# Patient Record
Sex: Male | Born: 1960 | Race: White | Hispanic: No | Marital: Single | Smoking: Former smoker
Health system: Southern US, Community
[De-identification: ages and names within clinical notes are randomized; demographics above are authoritative.]

## PROBLEM LIST (undated history)

## (undated) DIAGNOSIS — R7989 Other specified abnormal findings of blood chemistry: Secondary | ICD-10-CM

## (undated) DIAGNOSIS — I471 Supraventricular tachycardia, unspecified: Secondary | ICD-10-CM

## (undated) DIAGNOSIS — I4892 Unspecified atrial flutter: Secondary | ICD-10-CM

## (undated) DIAGNOSIS — Z8659 Personal history of other mental and behavioral disorders: Secondary | ICD-10-CM

## (undated) DIAGNOSIS — I480 Paroxysmal atrial fibrillation: Secondary | ICD-10-CM

## (undated) DIAGNOSIS — N182 Chronic kidney disease, stage 2 (mild): Secondary | ICD-10-CM

## (undated) DIAGNOSIS — I10 Essential (primary) hypertension: Secondary | ICD-10-CM

## (undated) DIAGNOSIS — Z86718 Personal history of other venous thrombosis and embolism: Secondary | ICD-10-CM

## (undated) DIAGNOSIS — D6851 Activated protein C resistance: Secondary | ICD-10-CM

## (undated) DIAGNOSIS — N200 Calculus of kidney: Secondary | ICD-10-CM

## (undated) DIAGNOSIS — N201 Calculus of ureter: Secondary | ICD-10-CM

## (undated) DIAGNOSIS — I4719 Other supraventricular tachycardia: Secondary | ICD-10-CM

## (undated) HISTORY — PX: ROTATOR CUFF REPAIR: SHX139

## (undated) HISTORY — PX: APPENDECTOMY: SHX54

## (undated) HISTORY — DX: Other specified abnormal findings of blood chemistry: R79.89

---

## 1998-07-07 ENCOUNTER — Emergency Department (HOSPITAL_COMMUNITY): Admission: EM | Admit: 1998-07-07 | Discharge: 1998-07-07 | Payer: Self-pay | Admitting: Emergency Medicine

## 2000-09-06 ENCOUNTER — Encounter (INDEPENDENT_AMBULATORY_CARE_PROVIDER_SITE_OTHER): Payer: Self-pay | Admitting: Specialist

## 2000-09-06 ENCOUNTER — Ambulatory Visit (HOSPITAL_COMMUNITY): Admission: RE | Admit: 2000-09-06 | Discharge: 2000-09-06 | Payer: Self-pay | Admitting: General Surgery

## 2002-04-19 ENCOUNTER — Emergency Department (HOSPITAL_COMMUNITY): Admission: EM | Admit: 2002-04-19 | Discharge: 2002-04-19 | Payer: Self-pay | Admitting: Emergency Medicine

## 2005-07-08 ENCOUNTER — Emergency Department (HOSPITAL_COMMUNITY): Admission: EM | Admit: 2005-07-08 | Discharge: 2005-07-08 | Payer: Self-pay | Admitting: Emergency Medicine

## 2007-05-22 ENCOUNTER — Emergency Department (HOSPITAL_COMMUNITY): Admission: EM | Admit: 2007-05-22 | Discharge: 2007-05-22 | Payer: Self-pay | Admitting: Emergency Medicine

## 2009-03-20 ENCOUNTER — Encounter: Admission: RE | Admit: 2009-03-20 | Discharge: 2009-03-20 | Payer: Self-pay | Admitting: Internal Medicine

## 2009-07-14 ENCOUNTER — Emergency Department (HOSPITAL_COMMUNITY): Admission: EM | Admit: 2009-07-14 | Discharge: 2009-07-14 | Payer: Self-pay | Admitting: Emergency Medicine

## 2011-03-10 LAB — POCT I-STAT, CHEM 8
Calcium, Ion: 1.13 mmol/L (ref 1.12–1.32)
Creatinine, Ser: 1.2 mg/dL (ref 0.4–1.5)
Glucose, Bld: 103 mg/dL — ABNORMAL HIGH (ref 70–99)
Hemoglobin: 15 g/dL (ref 13.0–17.0)
Sodium: 142 mEq/L (ref 135–145)
TCO2: 25 mmol/L (ref 0–100)

## 2011-03-10 LAB — CBC
HCT: 42.4 % (ref 39.0–52.0)
Hemoglobin: 14.5 g/dL (ref 13.0–17.0)
MCV: 88.4 fL (ref 78.0–100.0)
Platelets: 251 10*3/uL (ref 150–400)
WBC: 13 10*3/uL — ABNORMAL HIGH (ref 4.0–10.5)

## 2011-03-10 LAB — URINE CULTURE

## 2011-03-10 LAB — URINALYSIS, ROUTINE W REFLEX MICROSCOPIC
Bilirubin Urine: NEGATIVE
Ketones, ur: NEGATIVE mg/dL
Protein, ur: NEGATIVE mg/dL
Urobilinogen, UA: 1 mg/dL (ref 0.0–1.0)

## 2011-03-10 LAB — DIFFERENTIAL
Eosinophils Absolute: 0.1 10*3/uL (ref 0.0–0.7)
Eosinophils Relative: 1 % (ref 0–5)
Lymphs Abs: 2.1 10*3/uL (ref 0.7–4.0)
Monocytes Absolute: 0.5 10*3/uL (ref 0.1–1.0)
Monocytes Relative: 4 % (ref 3–12)

## 2011-03-10 LAB — URINE MICROSCOPIC-ADD ON

## 2011-09-19 LAB — POCT I-STAT CREATININE
Creatinine, Ser: 1.1
Operator id: 146091

## 2011-09-19 LAB — POCT CARDIAC MARKERS: Troponin i, poc: <0.05

## 2011-09-19 LAB — I-STAT 8, (EC8 V) (CONVERTED LAB)
Acid-Base Excess: 2
Bicarbonate: 29.3 — ABNORMAL HIGH
Hemoglobin: 16
Potassium: 4.4
Sodium: 140
TCO2: 31
pH, Ven: 7.319 — ABNORMAL HIGH

## 2013-12-23 ENCOUNTER — Ambulatory Visit (INDEPENDENT_AMBULATORY_CARE_PROVIDER_SITE_OTHER): Payer: BC Managed Care – PPO

## 2013-12-23 VITALS — Ht 72.0 in | Wt 210.0 lb

## 2013-12-23 DIAGNOSIS — B351 Tinea unguium: Secondary | ICD-10-CM

## 2013-12-23 DIAGNOSIS — R52 Pain, unspecified: Secondary | ICD-10-CM

## 2013-12-23 DIAGNOSIS — M722 Plantar fascial fibromatosis: Secondary | ICD-10-CM

## 2013-12-23 DIAGNOSIS — B353 Tinea pedis: Secondary | ICD-10-CM

## 2013-12-23 MED ORDER — EFINACONAZOLE 10 % EX SOLN: 1.0000 [drp] | Freq: Every day | CUTANEOUS | Status: DC

## 2013-12-23 MED ORDER — MELOXICAM 15 MG PO TABS: 15.0000 mg | ORAL_TABLET | Freq: Every day | ORAL | Status: DC

## 2013-12-23 NOTE — Patient Instructions (Signed)
Onychomycosis/Fungal Toenails  WHAT IS IT? An infection that lies within the keratin of your nail plate that is caused by a fungus.  WHY ME? Fungal infections affect all ages, sexes, races, and creeds.  There may be many factors that predispose you to a fungal infection such as age, coexisting medical conditions such as diabetes, or an autoimmune disease; stress, medications, fatigue, genetics, etc.  Bottom line: fungus thrives in a warm, moist environment and your shoes offer such a location.  IS IT CONTAGIOUS? Theoretically, yes.  You do not want to share shoes, nail clippers or files with someone who has fungal toenails.  Walking around barefoot in the same room or sleeping in the same bed is unlikely to transfer the organism.  It is important to realize, however, that fungus can spread easily from one nail to the next on the same foot.  HOW DO WE TREAT THIS?  There are several ways to treat this condition.  Treatment may depend on many factors such as age, medications, pregnancy, liver and kidney conditions, etc.  It is best to ask your doctor which options are available to you.  1. No treatment.   Unlike many other medical concerns, you can live with this condition.  However for many people this can be a painful condition and may lead to ingrown toenails or a bacterial infection.  It is recommended that you keep the nails cut short to help reduce the amount of fungal nail. 2. Topical treatment.  These range from herbal remedies to prescription strength nail lacquers.  About 40-50% effective, topicals require twice daily application for approximately 9 to 12 months or until an entirely new nail has grown out.  The most effective topicals are medical grade medications available through physicians offices. 3. Oral antifungal medications.  With an 80-90% cure rate, the most common oral medication requires 3 to 4 months of therapy and stays in your system for a year as the new nail grows out.  Oral  antifungal medications do require blood work to make sure it is a safe drug for you.  A liver function panel will be performed prior to starting the medication and after the first month of treatment.  It is important to have the blood work performed to avoid any harmful side effects.  In general, this medication safe but blood work is required. 4. Laser Therapy.  This treatment is performed by applying a specialized laser to the affected nail plate.  This therapy is noninvasive, fast, and non-painful.  It is not covered by insurance and is therefore, out of pocket.  The results have been very good with a 80-95% cure rate.  [facility] is the only practice in the area to offer this therapy. Permanent Nail Avulsion.  Removing the entire nail so that a new nail will not grow back   For that fungus nails apply topical Jublia to the affected nails daily for 12 months duration one drop to each nail as instructed daily       .ICE INSTRUCTIONS  Apply ice or cold pack to the affected area at least 3 times a day for 10-15 minutes each time.  You should also use ice after prolonged activity or vigorous exercise.  Do not apply ice longer than 20 minutes at one time.  Always keep a cloth between your skin and the ice pack to prevent burns.  Being consistent and following these instructions will help control your symptoms.  We suggest you purchase  a gel ice pack because they are reusable and do bit leak.  Some of them are designed to wrap around the area.  Use the method that works best for you.  Here are some other suggestions for icing.   Use a frozen bag of peas or corn-inexpensive and molds well to your body, usually stays frozen for 10 to 20 minutes.  Wet a towel with cold water and squeeze out the excess until it's damp.  Place in a bag in the freezer for 20 minutes. Then remove and use.

## 2013-12-23 NOTE — Progress Notes (Signed)
   Subjective:    Patient ID: Derek CritchleyRory B [name], male    DOB: [birth year], 53 y.o.   MRN: [id number]  HPI Comments: '' LT FOOT HEEL IS BEEN SORE FOR 1 MONTH AND ITS GETTING BETTER.  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       Objective:   Physical Exam Neurologically epicritic and proprioceptive sensations intact and symmetric bilateral there is normal plantar response DTRs not listed vascular status is intact with pedal pulses palpable DP and PT +2/4 bilateral Refill time 3 seconds all digits skin color and turgor normal there is some dry skin and some slight fissuring the skin patient does have onychomycosis and friability of nails 1 through 5 bilateral. Patient has moccasin distribution of tinea occasional pruritus in the past. Orthopedic biomechanical exam reveals pain on palpation Magan plantar fascia medial calcaneal tubercle left heel it is not exquisite pain x-rays reveal no inferior calcaneal spurring thickening of fascial structures no fracture or other osseous abnormality noted.       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 Plan at this time for the treatments fascial strapping applied to the left foot maintained for 5 days patient is dispensed literature on plantar fasciitis to follow also prescription for Mobic will be dispensed. For the mycotic nails prescription for [facility]. Applied nail topical antifungal daily for 12 months duration as instructed. Patient is also given proxy 10 tubes of Luzu topical antifungal if the tinea flares up. Recheck in 2 weeks for followup may be K. for  orthoses based on progress and also consider steroid injections if no improvement.  Alvan Dameichard Mailyn Steichen DPM

## 2014-01-06 ENCOUNTER — Ambulatory Visit (INDEPENDENT_AMBULATORY_CARE_PROVIDER_SITE_OTHER): Payer: BC Managed Care – PPO

## 2014-01-06 VITALS — BP 157/92 | HR 60 | Resp 12

## 2014-01-06 DIAGNOSIS — R52 Pain, unspecified: Secondary | ICD-10-CM

## 2014-01-06 DIAGNOSIS — M722 Plantar fascial fibromatosis: Secondary | ICD-10-CM

## 2014-01-06 NOTE — Patient Instructions (Signed)

## 2014-01-06 NOTE — Progress Notes (Signed)
   Subjective:    Patient ID: Derek [name], male    DOB: Mar 28, 1961, 53 y.o.   MRN: [id number]  HPI patient presents this time for followup of plantar fasciitis/heel spur syndrome left foot but really good by the second day with a strapping and placed first the hurt however by the fifth able to take him off it became painful again. Patient applying topical antifungal to the nails as instructed had improvement with fascial taping and would likely benefit from orthoses   Review of Systems no new changes or findings     Objective:   Physical Exam Neurovascular status intact and unchanged from 2 weeks ago pedal pulses palpable left foot has pain on palpation medial band plantar fascia consistent with plantar fasciitis/heel spur syndrome responded to the strapping would likely respond to functional orthoses       Assessment & Plan:  Assessment plantar fasciitis/heel spur syndrome left fascial strapping with beneficial at this time orthotic scanning for functional orthoses with Spenco top cover is carried out patient will followup in the next 4 weeks orthotic and fitting and dispensing once they've been manufactured in the interim maintain NSAID therapy maintain ice in stable shoes at all times  Alvan Dameichard Tabbetha Kutscher DPM

## 2014-02-19 ENCOUNTER — Ambulatory Visit: Payer: BC Managed Care – PPO

## 2014-02-19 ENCOUNTER — Ambulatory Visit (INDEPENDENT_AMBULATORY_CARE_PROVIDER_SITE_OTHER): Payer: BC Managed Care – PPO | Admitting: *Deleted

## 2014-02-19 VITALS — BP 119/73 | HR 54 | Resp 17 | Ht 72.0 in | Wt 220.0 lb

## 2014-02-19 DIAGNOSIS — M722 Plantar fascial fibromatosis: Secondary | ICD-10-CM

## 2014-02-19 NOTE — Progress Notes (Signed)
   Subjective:    Patient ID: Derek CritchleyRory B [name], male    DOB: [birth year], 53 y.o.   MRN: [id number] Pt presents for orthotic pick up.  Verbal and written instructions given and orthotics placed in the pt's workboot.  I encouraged pt to make an appt for 1 month follow up or sooner if concerns. HPI    Review of Systems     Objective:   Physical Exam        Assessment & Plan:

## 2014-02-19 NOTE — Patient Instructions (Signed)

## 2014-02-25 ENCOUNTER — Telehealth: Payer: Self-pay | Admitting: *Deleted

## 2014-02-25 NOTE — Telephone Encounter (Signed)
I've made 3 visits there for Plantar Fasciitis.  My foot is the same as I first came.  I have spent $199 on each foot for inserts.  It's not better at all.  I left him a message to call and schedule an appointment for follow up with Dr. Ralene CorkSikora.  He can give him further options for treatment such as a cortisone shot.

## 2014-04-22 ENCOUNTER — Encounter (HOSPITAL_COMMUNITY): Payer: Self-pay | Admitting: Emergency Medicine

## 2014-04-22 ENCOUNTER — Emergency Department (HOSPITAL_COMMUNITY)
Admission: EM | Admit: 2014-04-22 | Discharge: 2014-04-22 | Disposition: A | Discharge status: Home / Self Care | Payer: BC Managed Care – PPO | Attending: Emergency Medicine | Admitting: Emergency Medicine

## 2014-04-22 ENCOUNTER — Emergency Department (HOSPITAL_COMMUNITY): Payer: BC Managed Care – PPO

## 2014-04-22 DIAGNOSIS — N201 Calculus of ureter: Secondary | ICD-10-CM | POA: Insufficient documentation

## 2014-04-22 DIAGNOSIS — F17200 Nicotine dependence, unspecified, uncomplicated: Secondary | ICD-10-CM | POA: Insufficient documentation

## 2014-04-22 DIAGNOSIS — F410 Panic disorder [episodic paroxysmal anxiety] without agoraphobia: Secondary | ICD-10-CM | POA: Insufficient documentation

## 2014-04-22 DIAGNOSIS — Z79899 Other long term (current) drug therapy: Secondary | ICD-10-CM | POA: Insufficient documentation

## 2014-04-22 LAB — BASIC METABOLIC PANEL
BUN: 19 mg/dL (ref 6–23)
CHLORIDE: 103 meq/L (ref 96–112)
CO2: 23 meq/L (ref 19–32)
Calcium: 9 mg/dL (ref 8.4–10.5)
Creatinine, Ser: 1.22 mg/dL (ref 0.50–1.35)
GFR calc Af Amer: 78 mL/min — ABNORMAL LOW (ref >90)
GFR calc non Af Amer: 67 mL/min — ABNORMAL LOW (ref >90)
Glucose, Bld: 104 mg/dL — ABNORMAL HIGH (ref 70–99)
Potassium: 3.8 mEq/L (ref 3.7–5.3)
SODIUM: 141 meq/L (ref 137–147)

## 2014-04-22 LAB — CBC WITH DIFFERENTIAL/PLATELET
BASOS ABS: 0 10*3/uL (ref 0.0–0.1)
Basophils Relative: 0 % (ref 0–1)
Eosinophils Absolute: 0.4 10*3/uL (ref 0.0–0.7)
Eosinophils Relative: 3 % (ref 0–5)
HEMATOCRIT: 42.6 % (ref 39.0–52.0)
Hemoglobin: 14.4 g/dL (ref 13.0–17.0)
LYMPHS PCT: 46 % (ref 12–46)
Lymphs Abs: 4.8 10*3/uL — ABNORMAL HIGH (ref 0.7–4.0)
MCH: 29.8 pg (ref 26.0–34.0)
MCHC: 33.8 g/dL (ref 30.0–36.0)
MCV: 88 fL (ref 78.0–100.0)
Monocytes Absolute: 0.7 10*3/uL (ref 0.1–1.0)
Monocytes Relative: 7 % (ref 3–12)
NEUTROS ABS: 4.6 10*3/uL (ref 1.7–7.7)
Neutrophils Relative %: 44 % (ref 43–77)
PLATELETS: 290 10*3/uL (ref 150–400)
RBC: 4.84 MIL/uL (ref 4.22–5.81)
RDW: 13 % (ref 11.5–15.5)
WBC: 10.5 10*3/uL (ref 4.0–10.5)

## 2014-04-22 LAB — URINE MICROSCOPIC-ADD ON

## 2014-04-22 LAB — URINALYSIS, ROUTINE W REFLEX MICROSCOPIC
Glucose, UA: NEGATIVE mg/dL
Ketones, ur: NEGATIVE mg/dL
Nitrite: NEGATIVE
Protein, ur: 30 mg/dL — AB
Specific Gravity, Urine: 1.034 — ABNORMAL HIGH (ref 1.005–1.030)
Urobilinogen, UA: 1 mg/dL (ref 0.0–1.0)
pH: 5 (ref 5.0–8.0)

## 2014-04-22 MED ORDER — ONDANSETRON HCL 4 MG PO TABS: 4.0000 mg | ORAL_TABLET | Freq: Four times a day (QID) | ORAL | Status: DC

## 2014-04-22 MED ORDER — OXYCODONE-ACETAMINOPHEN 5-325 MG PO TABS: 2.0000 | ORAL_TABLET | ORAL | Status: DC | PRN

## 2014-04-22 MED ORDER — OXYCODONE-ACETAMINOPHEN 5-325 MG PO TABS
2.0000 | ORAL_TABLET | Freq: Once | ORAL | Status: AC
Start: 2014-04-22 — End: 2014-04-22
  Administered 2014-04-22: 2 via ORAL
  Filled 2014-04-22: qty 2

## 2014-04-22 MED ORDER — ONDANSETRON HCL 4 MG/2ML IJ SOLN
4.0000 mg | Freq: Once | INTRAMUSCULAR | Status: AC
  Administered 2014-04-22: 4 mg via INTRAVENOUS
  Filled 2014-04-22: qty 2

## 2014-04-22 MED ORDER — HYDROMORPHONE HCL PF 1 MG/ML IJ SOLN
1.0000 mg | Freq: Once | INTRAMUSCULAR | Status: AC
  Administered 2014-04-22: 1 mg via INTRAVENOUS
  Filled 2014-04-22: qty 1

## 2014-04-22 MED ORDER — KETOROLAC TROMETHAMINE 30 MG/ML IJ SOLN
30.0000 mg | Freq: Once | INTRAMUSCULAR | Status: AC
  Administered 2014-04-22: 30 mg via INTRAVENOUS
  Filled 2014-04-22: qty 1

## 2014-04-22 MED ORDER — IBUPROFEN 800 MG PO TABS: 800.0000 mg | ORAL_TABLET | Freq: Three times a day (TID) | ORAL | Status: DC

## 2014-04-22 MED ORDER — TAMSULOSIN HCL 0.4 MG PO CAPS: 0.4000 mg | ORAL_CAPSULE | Freq: Every day | ORAL | Status: DC

## 2014-04-22 MED ORDER — SODIUM CHLORIDE 0.9 % IV BOLUS (SEPSIS)
1000.0000 mL | Freq: Once | INTRAVENOUS | Status: AC
  Administered 2014-04-22: 1000 mL via INTRAVENOUS

## 2014-04-22 NOTE — ED Notes (Signed)
Rt sided flank pain  X 2 hours has hx of kidney stone

## 2014-04-22 NOTE — Discharge Instructions (Signed)
Ureteral Colic (Kidney Stones) Followup with the urologist tomorrow. Take pain medication as prescribed. Return to ED if you develop new or worsening symptoms. Ureteral colic is the result of a condition when kidney stones form inside the kidney. Once kidney stones are formed they may move into the tube that connects the kidney with the bladder (ureter). If this occurs, this condition may cause pain (colic) in the ureter.  CAUSES  Pain is caused by stone movement in the ureter and the obstruction caused by the stone. SYMPTOMS  The pain comes and goes as the ureter contracts around the stone. The pain is usually intense, sharp, and stabbing in character. The location of the pain may move as the stone moves through the ureter. When the stone is near the kidney the pain is usually located in the back and radiates to the belly (abdomen). When the stone is ready to pass into the bladder the pain is often located in the lower abdomen on the side the stone is located. At this location, the symptoms may mimic those of a urinary tract infection with urinary frequency. Once the stone is located here it often passes into the bladder and the pain disappears completely. TREATMENT   Your caregiver will provide you with medicine for pain relief.  You may require specialized follow-up X-rays.  The absence of pain does not always mean that the stone has passed. It may have just stopped moving. If the urine remains completely obstructed, it can cause loss of kidney function or even complete destruction of the involved kidney. It is your responsibility and in your interest that X-rays and follow-ups as suggested by your caregiver are completed. Relief of pain without passage of the stone can be associated with severe damage to the kidney, including loss of kidney function on that side.  If your stone does not pass on its own, additional measures may be taken by your caregiver to ensure its removal. HOME CARE  INSTRUCTIONS   Increase your fluid intake. Water is the preferred fluid since juices containing vitamin C may acidify the urine making it less likely for certain stones (uric acid stones) to pass.  Strain all urine. A strainer will be provided. Keep all particulate matter or stones for your caregiver to inspect.  Take your pain medicine as directed.  Make a follow-up appointment with your caregiver as directed.  Remember that the goal is passage of your stone. The absence of pain does not mean the stone is gone. Follow your caregiver's instructions.  Only take over-the-counter or prescription medicines for pain, discomfort, or fever as directed by your caregiver. SEEK MEDICAL CARE IF:   Pain cannot be controlled with the prescribed medicine.  You have a fever.  Pain continues for longer than your caregiver advises it should.  There is a change in the pain, and you develop chest discomfort or constant abdominal pain.  You feel faint or pass out. MAKE SURE YOU:   Understand these instructions.  Will watch your condition.  Will get help right away if you are not doing well or get worse. Document Released: 08/29/2005 Document Revised: 03/16/2013 Document Reviewed: 05/16/2011 [facility] Patient Information 2014 [employer], [place].

## 2014-04-22 NOTE — ED Provider Notes (Signed)
CSN: [id number]     Arrival date & time 04/22/14  1439 History   First MD Initiated Contact with Patient 04/22/14 1512     Chief Complaint  Patient presents with  . Flank Pain     (Consider location/radiation/quality/duration/timing/severity/associated sxs/prior Treatment) HPI Comments: Patient with acute onset of right flank pain one hour ago while at work. Pain is constant it does not radiate. Feels similar to previous kidney stones. Associated with nausea and one episode of vomiting. Denies any fever or abdominal pain. No testicular pain. No previous intervention for kidney stones. Then take anything for pain. He's had previous appendectomy. Pain does not radiate down the legs. No focal weakness or tingling. No bowel or bladder incontinence.  The history is provided by the patient.    Past Medical History  Diagnosis Date  . Kidney stone   . Panic attacks    Past Surgical History  Procedure Laterality Date  . Appendectomy     No family history on file. History  Substance Use Topics  . Smoking status: Current Some Day Smoker  . Smokeless tobacco: Not on file  . Alcohol Use: Yes    Review of Systems  Constitutional: Negative for fever, activity change and appetite change.  Respiratory: Negative for chest tightness and shortness of breath.   Gastrointestinal: Positive for nausea and vomiting. Negative for abdominal pain.  Genitourinary: Positive for flank pain. Negative for decreased urine volume.  Musculoskeletal: Positive for back pain. Negative for arthralgias and myalgias.  Skin: Negative for rash.      Allergies  Review of patient's allergies indicates no known allergies.  Home Medications   Prior to Admission medications   Medication Sig Start Date End Date Taking? Authorizing Provider  ALPRAZolam Prudy Feeler(XANAX) 0.5 MG tablet Take 0.5 mg by mouth daily.  12/08/13  Yes Historical Provider, MD  sertraline (ZOLOFT) 50 MG tablet Take 50 mg by mouth daily.  12/08/13  Yes  Historical Provider, MD   BP 121/82  Pulse 48  Temp(Src) 98 F (36.7 C) (Oral)  Resp 18  SpO2 95% Physical Exam  Constitutional: He is oriented to person, place, and time. He appears well-developed and well-nourished. No distress.  HENT:  Head: Normocephalic and atraumatic.  Mouth/Throat: Oropharynx is clear and moist. No oropharyngeal exudate.  Eyes: Conjunctivae and EOM are normal. Pupils are equal, round, and reactive to light.  Neck: Normal range of motion. Neck supple.  Cardiovascular: Normal rate, regular rhythm and normal heart sounds.   No murmur heard. Pulmonary/Chest: Effort normal and breath sounds normal. No respiratory distress.  Abdominal: Soft. There is no tenderness. There is no rebound and no guarding.  No right lower quadrant tenderness. No pain at McBurney's point  Genitourinary:  No testicular tenderness  Musculoskeletal: Normal range of motion. He exhibits tenderness. He exhibits no edema.  Right CVA tenderness  Neurological: He is alert and oriented to person, place, and time. No cranial nerve deficit. He exhibits normal muscle tone. Coordination normal.  Skin: Skin is warm.    ED Course  Procedures (including critical care time) Labs Review Labs Reviewed  URINALYSIS, ROUTINE W REFLEX MICROSCOPIC - Abnormal; Notable for the following:    Color, Urine AMBER (*)    APPearance CLOUDY (*)    Specific Gravity, Urine 1.034 (*)    Hgb urine dipstick LARGE (*)    Bilirubin Urine SMALL (*)    Protein, ur 30 (*)    Leukocytes, UA SMALL (*)    All other components within  normal limits  CBC WITH DIFFERENTIAL - Abnormal; Notable for the following:    Lymphs Abs 4.8 (*)    All other components within normal limits  BASIC METABOLIC PANEL - Abnormal; Notable for the following:    Glucose, Bld 104 (*)    GFR calc non Af Amer 67 (*)    GFR calc Af Amer 78 (*)    All other components within normal limits  URINE MICROSCOPIC-ADD ON - Abnormal; Notable for the  following:    Squamous Epithelial / LPF FEW (*)    All other components within normal limits  URINE CULTURE    Imaging Review Ct Abdomen Pelvis Wo Contrast  04/22/2014   CLINICAL DATA:  Right flank pain, hematuria  EXAM: CT ABDOMEN AND PELVIS WITHOUT CONTRAST  TECHNIQUE: Multidetector CT imaging of the abdomen and pelvis was performed following the standard protocol without IV contrast.  COMPARISON:  07/15/2011.  FINDINGS: Sagittal images of the spine shows significant disc space flattening with vacuum disc phenomenon mild anterior and mild posterior spurring at L4-L5 and L5-S1 level. Mild disc space flattening at L3-L4 level.  Lung bases shows bilateral posterior atelectasis. Small hiatal hernia.  Unenhanced liver is unremarkable. No calcified gallstones are noted within gallbladder. The pancreas, spleen and adrenal glands are unremarkable. Small accessory splenule.  There is mild left hydronephrosis and left hydroureter. Mild left perinephric stranding. There is nonobstructive punctate calcified calculus in lower pole of the left kidney measures 1.8 mm.  In axial image 60 there is calcified obstructive calculus in mid right ureter measures 6.6 mm at the level of L4 vertebral body. Bilateral distal ureter is unremarkable. A prostate gland calcification is noted. No calcified calculi are noted within urinary bladder.  No aortic aneurysm. No small bowel obstruction. Stool noted within cecum. No pericecal inflammation. The terminal ileum is unremarkable. No distal colonic obstruction. No inguinal adenopathy. No destructive bony lesions are noted within pelvis. Mild degenerative changes bilateral SI joints.  IMPRESSION: 1. There is mild right hydronephrosis and right proximal hydroureter. Mild right perinephric stranding. 2. There is 6.6 mm calcified obstructive calculus in mid right ureter at the level of L4 vertebral body. 3. Left nonobstructive nephrolithiasis. No left ureteral calculi. No calcified calculi  are noted within urinary bladder. 4. No pericecal inflammation. 5. Degenerative changes lumbar spine and bilateral SI joints.   Electronically Signed   By: Natasha MeadLiviu  Pop M.D.   On: 04/22/2014 16:08     EKG Interpretation None      MDM   Final diagnoses:  Ureterolithiasis   Right flank pain with nausea and vomiting similar to previous kidney stone. No fever. No urinary symptoms.  Urinalysis shows hematuria without evidence of obvious infection. CT scan shows 6.6 mm right mid ureteral calculus. This was discussed with on-call urology Dr. Vernie Ammonsttelin who feels patient should call office tomorrow for an appointment for the weekend. Patient's pain is well controlled in the ED is not any vomiting. His creatinine is normal.  He is tolerating PO in the ED. Pain is controlled, no vomiting.  Will be discharged on pain medication, flomax, and antiinflammatories for follow up with urology tomorrow. Return precautions discussed.  Glynn OctaveStephen Chania Kochanski, MD 04/23/14 [phone]

## 2014-04-23 ENCOUNTER — Ambulatory Visit (HOSPITAL_BASED_OUTPATIENT_CLINIC_OR_DEPARTMENT_OTHER)
Admission: RE | Admit: 2014-04-23 | Discharge: 2014-04-23 | Disposition: A | Discharge status: Home / Self Care | Payer: BC Managed Care – PPO | Source: Ambulatory Visit | Attending: Urology | Admitting: Urology

## 2014-04-23 ENCOUNTER — Ambulatory Visit (HOSPITAL_BASED_OUTPATIENT_CLINIC_OR_DEPARTMENT_OTHER): Payer: BC Managed Care – PPO | Admitting: Anesthesiology

## 2014-04-23 ENCOUNTER — Emergency Department (HOSPITAL_COMMUNITY)
Admission: EM | Admit: 2014-04-23 | Discharge: 2014-04-23 | Disposition: A | Discharge status: Home / Self Care | Payer: BC Managed Care – PPO | Attending: Emergency Medicine | Admitting: Emergency Medicine

## 2014-04-23 ENCOUNTER — Other Ambulatory Visit: Payer: Self-pay | Admitting: Urology

## 2014-04-23 ENCOUNTER — Encounter (HOSPITAL_BASED_OUTPATIENT_CLINIC_OR_DEPARTMENT_OTHER): Payer: BC Managed Care – PPO | Admitting: Anesthesiology

## 2014-04-23 ENCOUNTER — Encounter (HOSPITAL_BASED_OUTPATIENT_CLINIC_OR_DEPARTMENT_OTHER): Payer: Self-pay | Admitting: *Deleted

## 2014-04-23 ENCOUNTER — Encounter (HOSPITAL_BASED_OUTPATIENT_CLINIC_OR_DEPARTMENT_OTHER)
Admission: RE | Disposition: A | Discharge status: Home / Self Care | Payer: Self-pay | Source: Ambulatory Visit | Attending: Urology

## 2014-04-23 DIAGNOSIS — N200 Calculus of kidney: Secondary | ICD-10-CM | POA: Insufficient documentation

## 2014-04-23 DIAGNOSIS — N201 Calculus of ureter: Secondary | ICD-10-CM | POA: Insufficient documentation

## 2014-04-23 DIAGNOSIS — F17200 Nicotine dependence, unspecified, uncomplicated: Secondary | ICD-10-CM | POA: Insufficient documentation

## 2014-04-23 DIAGNOSIS — F410 Panic disorder [episodic paroxysmal anxiety] without agoraphobia: Secondary | ICD-10-CM | POA: Insufficient documentation

## 2014-04-23 DIAGNOSIS — I498 Other specified cardiac arrhythmias: Secondary | ICD-10-CM | POA: Insufficient documentation

## 2014-04-23 DIAGNOSIS — N1330 Unspecified hydronephrosis: Secondary | ICD-10-CM | POA: Insufficient documentation

## 2014-04-23 DIAGNOSIS — F411 Generalized anxiety disorder: Secondary | ICD-10-CM | POA: Insufficient documentation

## 2014-04-23 DIAGNOSIS — Z87891 Personal history of nicotine dependence: Secondary | ICD-10-CM | POA: Insufficient documentation

## 2014-04-23 DIAGNOSIS — Z9089 Acquired absence of other organs: Secondary | ICD-10-CM | POA: Insufficient documentation

## 2014-04-23 DIAGNOSIS — D682 Hereditary deficiency of other clotting factors: Secondary | ICD-10-CM | POA: Insufficient documentation

## 2014-04-23 DIAGNOSIS — Z79899 Other long term (current) drug therapy: Secondary | ICD-10-CM | POA: Insufficient documentation

## 2014-04-23 HISTORY — DX: Personal history of other mental and behavioral disorders: Z86.59

## 2014-04-23 HISTORY — DX: Calculus of ureter: N20.1

## 2014-04-23 HISTORY — DX: Calculus of kidney: N20.0

## 2014-04-23 HISTORY — PX: OTHER SURGICAL HISTORY: SHX169

## 2014-04-23 HISTORY — PX: CYSTOSCOPY W/ URETERAL STENT PLACEMENT: SHX1429

## 2014-04-23 LAB — URINALYSIS, ROUTINE W REFLEX MICROSCOPIC
Glucose, UA: NEGATIVE mg/dL
KETONES UR: NEGATIVE mg/dL
Leukocytes, UA: NEGATIVE
NITRITE: NEGATIVE
Protein, ur: 30 mg/dL — AB
Specific Gravity, Urine: 1.039 — ABNORMAL HIGH (ref 1.005–1.030)
UROBILINOGEN UA: 0.2 mg/dL (ref 0.0–1.0)
pH: 5 (ref 5.0–8.0)

## 2014-04-23 LAB — URINE CULTURE
COLONY COUNT: NO GROWTH
CULTURE: NO GROWTH

## 2014-04-23 LAB — URINE MICROSCOPIC-ADD ON

## 2014-04-23 SURGERY — CYSTOSCOPY, WITH RETROGRADE PYELOGRAM AND URETERAL STENT INSERTION
Anesthesia: General | Site: Ureter | Laterality: Right

## 2014-04-23 MED ORDER — HYOSCYAMINE SULFATE 0.125 MG SL SUBL
SUBLINGUAL_TABLET | SUBLINGUAL | Status: AC
  Filled 2014-04-23: qty 1

## 2014-04-23 MED ORDER — OXYCODONE-ACETAMINOPHEN 5-325 MG PO TABS
ORAL_TABLET | ORAL | Status: AC
  Filled 2014-04-23: qty 1

## 2014-04-23 MED ORDER — TAMSULOSIN HCL 0.4 MG PO CAPS
0.4000 mg | ORAL_CAPSULE | Freq: Every day | ORAL | Status: DC
  Administered 2014-04-23: 0.4 mg via ORAL
  Filled 2014-04-23: qty 1

## 2014-04-23 MED ORDER — ONDANSETRON HCL 4 MG/2ML IJ SOLN
4.0000 mg | Freq: Once | INTRAMUSCULAR | Status: AC
  Administered 2014-04-23: 4 mg via INTRAVENOUS
  Filled 2014-04-23: qty 2

## 2014-04-23 MED ORDER — FENTANYL CITRATE 0.05 MG/ML IJ SOLN
INTRAMUSCULAR | Status: AC
  Filled 2014-04-23: qty 4

## 2014-04-23 MED ORDER — TAMSULOSIN HCL 0.4 MG PO CAPS: 0.4000 mg | ORAL_CAPSULE | Freq: Every day | ORAL | Status: DC

## 2014-04-23 MED ORDER — LACTATED RINGERS IV SOLN
INTRAVENOUS | Status: DC
  Administered 2014-04-23: 12:00:00 via INTRAVENOUS
  Filled 2014-04-23: qty 1000

## 2014-04-23 MED ORDER — KETOROLAC TROMETHAMINE 30 MG/ML IJ SOLN
30.0000 mg | Freq: Once | INTRAMUSCULAR | Status: AC
  Administered 2014-04-23: 30 mg via INTRAVENOUS
  Filled 2014-04-23: qty 1

## 2014-04-23 MED ORDER — HYOSCYAMINE SULFATE 0.125 MG PO TABS: 0.1250 mg | ORAL_TABLET | ORAL | Status: DC | PRN

## 2014-04-23 MED ORDER — HYDROMORPHONE HCL PF 1 MG/ML IJ SOLN
1.0000 mg | Freq: Once | INTRAMUSCULAR | Status: AC
  Administered 2014-04-23: 1 mg via INTRAVENOUS
  Filled 2014-04-23: qty 1

## 2014-04-23 MED ORDER — BELLADONNA ALKALOIDS-OPIUM 16.2-60 MG RE SUPP
RECTAL | Status: AC
  Filled 2014-04-23: qty 1

## 2014-04-23 MED ORDER — HYDROMORPHONE HCL PF 1 MG/ML IJ SOLN
0.5000 mg | Freq: Once | INTRAMUSCULAR | Status: AC
  Administered 2014-04-23: 0.5 mg via INTRAVENOUS
  Filled 2014-04-23: qty 1

## 2014-04-23 MED ORDER — IOHEXOL 350 MG/ML SOLN
INTRAVENOUS | Status: DC | PRN
  Administered 2014-04-23: 15 mL via INTRAVENOUS

## 2014-04-23 MED ORDER — FENTANYL CITRATE 0.05 MG/ML IJ SOLN
100.0000 ug | Freq: Once | INTRAMUSCULAR | Status: DC
  Filled 2014-04-23: qty 2

## 2014-04-23 MED ORDER — PHENAZOPYRIDINE HCL 200 MG PO TABS: 200.0000 mg | ORAL_TABLET | Freq: Three times a day (TID) | ORAL | Status: DC | PRN

## 2014-04-23 MED ORDER — MEPERIDINE HCL 25 MG/ML IJ SOLN
6.2500 mg | INTRAMUSCULAR | Status: DC | PRN
  Filled 2014-04-23: qty 1

## 2014-04-23 MED ORDER — DEXAMETHASONE SODIUM PHOSPHATE 10 MG/ML IJ SOLN
INTRAMUSCULAR | Status: DC | PRN
  Administered 2014-04-23: 10 mg via INTRAVENOUS

## 2014-04-23 MED ORDER — MIDAZOLAM HCL 5 MG/5ML IJ SOLN
INTRAMUSCULAR | Status: DC | PRN
  Administered 2014-04-23: 2 mg via INTRAVENOUS

## 2014-04-23 MED ORDER — PROMETHAZINE HCL 25 MG/ML IJ SOLN
6.2500 mg | INTRAMUSCULAR | Status: DC | PRN
  Filled 2014-04-23: qty 1

## 2014-04-23 MED ORDER — ONDANSETRON HCL 4 MG/2ML IJ SOLN
INTRAMUSCULAR | Status: DC | PRN
  Administered 2014-04-23: 4 mg via INTRAVENOUS

## 2014-04-23 MED ORDER — HYOSCYAMINE SULFATE 0.125 MG SL SUBL
0.1250 mg | SUBLINGUAL_TABLET | SUBLINGUAL | Status: DC | PRN
  Administered 2014-04-23: 0.125 mg via SUBLINGUAL
  Filled 2014-04-23: qty 1

## 2014-04-23 MED ORDER — PHENAZOPYRIDINE HCL 100 MG PO TABS
ORAL_TABLET | ORAL | Status: AC
  Filled 2014-04-23: qty 2

## 2014-04-23 MED ORDER — ONDANSETRON 4 MG PO TBDP
8.0000 mg | ORAL_TABLET | Freq: Once | ORAL | Status: AC
  Administered 2014-04-23: 8 mg via ORAL
  Filled 2014-04-23: qty 2

## 2014-04-23 MED ORDER — LACTATED RINGERS IV SOLN
INTRAVENOUS | Status: DC
  Filled 2014-04-23: qty 1000

## 2014-04-23 MED ORDER — TAMSULOSIN HCL 0.4 MG PO CAPS
ORAL_CAPSULE | ORAL | Status: AC
  Filled 2014-04-23: qty 1

## 2014-04-23 MED ORDER — MIDAZOLAM HCL 2 MG/2ML IJ SOLN
INTRAMUSCULAR | Status: AC
  Filled 2014-04-23: qty 2

## 2014-04-23 MED ORDER — FENTANYL CITRATE 0.05 MG/ML IJ SOLN
INTRAMUSCULAR | Status: DC | PRN
  Administered 2014-04-23: 25 ug via INTRAVENOUS
  Administered 2014-04-23: 50 ug via INTRAVENOUS

## 2014-04-23 MED ORDER — LIDOCAINE HCL (CARDIAC) 20 MG/ML IV SOLN
INTRAVENOUS | Status: DC | PRN
  Administered 2014-04-23: 60 mg via INTRAVENOUS

## 2014-04-23 MED ORDER — PHENAZOPYRIDINE HCL 200 MG PO TABS
200.0000 mg | ORAL_TABLET | Freq: Three times a day (TID) | ORAL | Status: DC
  Administered 2014-04-23: 200 mg via ORAL
  Filled 2014-04-23: qty 1

## 2014-04-23 MED ORDER — OXYCODONE-ACETAMINOPHEN 5-325 MG PO TABS
1.0000 | ORAL_TABLET | ORAL | Status: DC | PRN
  Filled 2014-04-23: qty 1

## 2014-04-23 MED ORDER — CEPHALEXIN 500 MG PO CAPS: 500.0000 mg | ORAL_CAPSULE | Freq: Three times a day (TID) | ORAL | Status: DC

## 2014-04-23 MED ORDER — KETOROLAC TROMETHAMINE 30 MG/ML IJ SOLN
INTRAMUSCULAR | Status: DC | PRN
  Administered 2014-04-23 – 2014-05-03 (×2): 30 mg via INTRAVENOUS

## 2014-04-23 MED ORDER — FENTANYL CITRATE 0.05 MG/ML IJ SOLN
25.0000 ug | INTRAMUSCULAR | Status: DC | PRN
  Filled 2014-04-23: qty 1

## 2014-04-23 MED ORDER — STERILE WATER FOR IRRIGATION IR SOLN
Status: DC | PRN
  Administered 2014-04-23: 1000 mL

## 2014-04-23 MED ORDER — PROPOFOL 10 MG/ML IV BOLUS
INTRAVENOUS | Status: DC | PRN
  Administered 2014-04-23: 150 mg via INTRAVENOUS

## 2014-04-23 MED ORDER — LIDOCAINE HCL 2 % EX GEL
CUTANEOUS | Status: DC | PRN
  Administered 2014-04-23: 1 via URETHRAL

## 2014-04-23 MED ORDER — SENNOSIDES-DOCUSATE SODIUM 8.6-50 MG PO TABS: 1.0000 | ORAL_TABLET | Freq: Two times a day (BID) | ORAL | Status: DC

## 2014-04-23 MED ORDER — BELLADONNA ALKALOIDS-OPIUM 16.2-60 MG RE SUPP
RECTAL | Status: DC | PRN
  Administered 2014-04-23: 1 via RECTAL

## 2014-04-23 MED ORDER — OXYCODONE-ACETAMINOPHEN 5-325 MG PO TABS: 1.0000 | ORAL_TABLET | ORAL | Status: DC | PRN

## 2014-04-23 MED ORDER — CEFAZOLIN SODIUM-DEXTROSE 2-3 GM-% IV SOLR
2.0000 g | INTRAVENOUS | Status: AC
  Administered 2014-04-23: 2 g via INTRAVENOUS
  Filled 2014-04-23: qty 50

## 2014-04-23 SURGICAL SUPPLY — 17 items
BAG DRAIN URO-CYSTO SKYTR STRL (DRAIN) ×2 IMPLANT
CANISTER SUCT LVC 12 LTR MEDI- (MISCELLANEOUS) ×2 IMPLANT
CATH INTERMIT  6FR 70CM (CATHETERS) IMPLANT
CATH URET 5FR 28IN OPEN ENDED (CATHETERS) ×2 IMPLANT
CLOTH BEACON ORANGE TIMEOUT ST (SAFETY) ×2 IMPLANT
DRAPE CAMERA CLOSED 9X96 (DRAPES) ×2 IMPLANT
GLOVE BIO SURGEON STRL SZ7 (GLOVE) ×2 IMPLANT
GLOVE INDICATOR 7.5 STRL GRN (GLOVE) ×2 IMPLANT
GLOVE SURG SS PI 7.5 STRL IVOR (GLOVE) ×4 IMPLANT
GOWN PREVENTION PLUS LG XLONG (DISPOSABLE) ×2 IMPLANT
GOWN STRL REUS W/ TWL XL LVL3 (GOWN DISPOSABLE) ×2 IMPLANT
GOWN STRL REUS W/TWL XL LVL3 (GOWN DISPOSABLE) ×2
GUIDEWIRE 0.038 PTFE COATED (WIRE) IMPLANT
GUIDEWIRE STR DUAL SENSOR (WIRE) ×2 IMPLANT
PACK CYSTOSCOPY (CUSTOM PROCEDURE TRAY) ×2 IMPLANT
STENT POLARIS 5FRX26 (STENTS) ×2 IMPLANT
WATER STERILE IRR 3000ML UROMA (IV SOLUTION) ×2 IMPLANT

## 2014-04-23 NOTE — Op Note (Signed)
Urology Operative Report  Date of Procedure: 04/23/14  Surgeon: Natalia Leatherwood, MD Assistant:  None  Preoperative Diagnosis: Right ureter stone. Postoperative Diagnosis:  Same  Procedure(s): Right ureter stent placement. Cystoscopy. Right retrograde pyelogram with interpretation.  Estimated blood loss: None  Specimen: None  Drains: None  Complications: None  Findings: Right mid ureter stone. Right hydronephrosis.  History of present illness: 53 year old male presents with a right ureter stone. It is located in the mid ureter. It is associated with nausea and severe flank pain. His difficult time controlling his pain and has elected to present for right ureter stent placement with plans for staged right ureteroscopy.   Procedure in detail: After informed consent was obtained, the patient was taken to the operating room. They were placed in the supine position. SCDs were turned on and in place. IV antibiotics were infused, and general anesthesia was induced. A timeout was performed in which the correct patient, surgical site, and procedure were identified and agreed upon by the team.  The patient was placed in a dorsolithotomy position, making sure to pad all pertinent neurovascular pressure points. A belladonna and opium suppository was placed into the rectum. The genitals were prepped and draped in the usual sterile fashion.  A rigid cystoscope was advanced through the urethra and into the bladder. The bladder was drained. It was then fully distended and evaluated in a systematic fashion to visualize the entire surface of the bladder. This was negative for tumors.  Fluoroscopy revealed a density over the course of the right ureter in the mid ureter consistent with the stone seen on CT scan.  Attention was turned to the right ureter orifice. It was cannulated with a 5 [place] ureter catheter. I injected 10 cc of Omnipaque to obtain a retrograde pyelogram. There was a filling  defect which precluded contrast from passing. I injected an additional 10 cc of Omnipaque and this was able to pass beyond the stone and into the right collecting system. This revealed hydronephrosis. This side did not drain well.  I then placed a sensor wire through the cystoscope up the right ureter beyond the stone into the right renal pelvis on fluoroscopy.  I placed a right ureter stent over the wire using a 5 x 26 Polaris without tether. This was placed over the wire through the cystoscope under fluoroscopy with ease. This was deployed with a curl in the right renal pelvis and the loops within the bladder.  The bladder was drained and the cystoscope was removed.  I placed 10 cc of lidocaine jelly into the urethra and this completed the procedure. He was placed in a supine position, anesthesia was reversed, and he was taken to the PACU in stable condition.  All counts were correct at the end of the case.  He was given a prescription for pain medications, meds for stent discomfort, and a few days of Keflex. He'll be scheduled for staged right ureteroscopy.

## 2014-04-23 NOTE — ED Notes (Signed)
Pt states he's leaving. Feels much better. Went on to urology office.

## 2014-04-23 NOTE — ED Provider Notes (Signed)
CSN: [id number]     Arrival date & time 04/23/14  [id number] History   First MD Initiated Contact with Patient 04/23/14 [phone]     Chief Complaint  Patient presents with  . Flank Pain     (Consider location/radiation/quality/duration/timing/severity/associated sxs/prior Treatment) HPI  Patient to the ER for pain control of his kidney stone. He was seen in the ED yesterday and diagnosed with a 6.6 mm stone to the right flank. The ER attending spoke with Dr. Vernie [name] who says they will see the patient tomorrow in the office if the family calls first thing in the morning. He says that since leaving the hospital he has not done with and has been experiencing excruciating pain. He feels as though he has been vomiting up his pain medication because the pain is so bad before it has the opportunity to kick in that he throws it up. He notes decreased by normal color urine. He asks for pain medication. Denies fevers, , diarrhea, weakness, diarrhea.   Past Medical History  Diagnosis Date  . Kidney stone   . Panic attacks    Past Surgical History  Procedure Laterality Date  . Appendectomy     No family history on file. History  Substance Use Topics  . Smoking status: Current Some Day Smoker  . Smokeless tobacco: Not on file  . Alcohol Use: Yes    Review of Systems  Gastrointestinal: Positive for nausea and vomiting. Negative for diarrhea.  Genitourinary: Positive for flank pain (and flank pain).  All other systems reviewed and are negative.     Allergies  Review of patient's allergies indicates no known allergies.  Home Medications   Prior to Admission medications   Medication Sig Start Date End Date Taking? Authorizing Provider  ALPRAZolam Prudy Feeler(XANAX) 0.5 MG tablet Take 0.5 mg by mouth daily.  12/08/13  Yes Historical Provider, MD  ondansetron (ZOFRAN) 4 MG tablet Take 1 tablet (4 mg total) by mouth every 6 (six) hours. 04/22/14  Yes Glynn OctaveStephen Rancour, MD  oxyCODONE-acetaminophen  (PERCOCET/ROXICET) 5-325 MG per tablet Take 2 tablets by mouth every 4 (four) hours as needed for severe pain. 04/22/14  Yes Glynn OctaveStephen Rancour, MD  sertraline (ZOLOFT) 50 MG tablet Take 50 mg by mouth daily.  12/08/13  Yes Historical Provider, MD  tamsulosin (FLOMAX) 0.4 MG CAPS capsule Take 1 capsule (0.4 mg total) by mouth daily. 04/22/14  Yes Glynn OctaveStephen Rancour, MD  ibuprofen (ADVIL,MOTRIN) 800 MG tablet Take 1 tablet (800 mg total) by mouth 3 (three) times daily. 04/22/14   Glynn OctaveStephen Rancour, MD   BP 107/78  Pulse 58  Temp(Src) 98 F (36.7 C) (Oral)  Resp 18  Ht 6' (1.829 m)  Wt 215 lb (97.523 kg)  BMI 29.15 kg/m2  SpO2 95% Physical Exam Constitutional: He is oriented to person, place, and time. He appears well-developed and well-nourished. No distress.  HENT:  Head: Normocephalic and atraumatic.  Mouth/Throat: Oropharynx is clear and moist. No oropharyngeal exudate.  Eyes: Conjunctivae and EOM are normal. Pupils are equal, round, and reactive to light.  Neck: Normal range of motion. Neck supple.  Cardiovascular: Normal rate, regular rhythm and normal heart sounds.  No murmur heard.  Pulmonary/Chest: Effort normal and breath sounds normal. No respiratory distress.  Abdominal: Soft. There is no tenderness. There is no rebound and no guarding.  No right lower quadrant or right upper quadrant tenderness Musculoskeletal: Normal range of motion. He exhibits tenderness. He exhibits no edema.  Right CVA tenderness  Neurological: He is  alert and oriented to person, place, and time. No cranial nerve deficit. He exhibits normal muscle tone. Coordination normal.  Skin: Skin is warm.    ED Course  Procedures (including critical care time) Labs Review Labs Reviewed  URINALYSIS, ROUTINE W REFLEX MICROSCOPIC - Abnormal; Notable for the following:    Color, Urine AMBER (*)    APPearance TURBID (*)    Specific Gravity, Urine 1.039 (*)    Hgb urine dipstick TRACE (*)    Bilirubin Urine SMALL (*)     Protein, ur 30 (*)    All other components within normal limits  URINE MICROSCOPIC-ADD ON    Imaging Review Ct Abdomen Pelvis Wo Contrast  04/22/2014   CLINICAL DATA:  Right flank pain, hematuria  EXAM: CT ABDOMEN AND PELVIS WITHOUT CONTRAST  TECHNIQUE: Multidetector CT imaging of the abdomen and pelvis was performed following the standard protocol without IV contrast.  COMPARISON:  07/15/2011.  FINDINGS: Sagittal images of the spine shows significant disc space flattening with vacuum disc phenomenon mild anterior and mild posterior spurring at L4-L5 and L5-S1 level. Mild disc space flattening at L3-L4 level.  Lung bases shows bilateral posterior atelectasis. Small hiatal hernia.  Unenhanced liver is unremarkable. No calcified gallstones are noted within gallbladder. The pancreas, spleen and adrenal glands are unremarkable. Small accessory splenule.  There is mild left hydronephrosis and left hydroureter. Mild left perinephric stranding. There is nonobstructive punctate calcified calculus in lower pole of the left kidney measures 1.8 mm.  In axial image 60 there is calcified obstructive calculus in mid right ureter measures 6.6 mm at the level of L4 vertebral body. Bilateral distal ureter is unremarkable. A prostate gland calcification is noted. No calcified calculi are noted within urinary bladder.  No aortic aneurysm. No small bowel obstruction. Stool noted within cecum. No pericecal inflammation. The terminal ileum is unremarkable. No distal colonic obstruction. No inguinal adenopathy. No destructive bony lesions are noted within pelvis. Mild degenerative changes bilateral SI joints.  IMPRESSION: 1. There is mild right hydronephrosis and right proximal hydroureter. Mild right perinephric stranding. 2. There is 6.6 mm calcified obstructive calculus in mid right ureter at the level of L4 vertebral body. 3. Left nonobstructive nephrolithiasis. No left ureteral calculi. No calcified calculi are noted within  urinary bladder. 4. No pericecal inflammation. 5. Degenerative changes lumbar spine and bilateral SI joints.   Electronically Signed   By: Natasha Mead M.D.   On: 04/22/2014 16:08     EKG Interpretation None      MDM   Final diagnoses:  Kidney stone on right side    Patients pain managed with IV medications. He responded well to 1 mg IV Dilaudid and 30 mg IV Toradol. Will monitor patient until urology office opens to ensure that he will be seen today.   The patients wife reports that she called the office and he will be seen at 9am. It is currently 8:36am, will discharge patient straight to office.  53 y.o.Derek [name]'s evaluation in the Emergency Department is complete. It has been determined that no acute conditions requiring further emergency intervention are present at this time. The patient/guardian have been advised of the diagnosis and plan. We have discussed signs and symptoms that warrant return to the ED, such as changes or worsening in symptoms.  Vital signs are stable at discharge. Filed Vitals:   04/23/14 0800  BP: 107/78  Pulse: 58  Temp:   Resp:     Patient/guardian has voiced understanding and  agreed to follow-up with the PCP or specialist.     Dorthula Matas, [name] 04/23/14 [phone]  After discharge the patient went out to the car and says the Urology office called them and reported that they needed to cancel and reschedule his appointment. While at the car, the patient started to vomit. Dr. Blinda Leatherwood and I recommend moving patient back into his room, I will call on-call Urologist and ask if he can be transferred to [facility] for admission.   9:00am- I spokew ith Dr. Sharyn Creamer. He says it is probably best to send patient to the office, appt time is 10:15 am. From then if he needs a stent, he will put him on the list to be done at [place] from the office.  Patient to be discharged at 10am. Still not having any pain, but vomiting. No signs of sepsis, normal vitals. Pt  doesn't appear sick.    Dorthula Matas, [name] 04/23/14 [phone]

## 2014-04-23 NOTE — ED Notes (Signed)
Zavitz, MD speaking with pt and family at this time

## 2014-04-23 NOTE — Anesthesia Procedure Notes (Signed)
Procedure Name: LMA Insertion Date/Time: 04/23/2014 12:45 PM Performed by: Renella Cunas D Pre-anesthesia Checklist: Patient identified, Emergency Drugs available, Suction available and Patient being monitored Patient Re-evaluated:Patient Re-evaluated prior to inductionOxygen Delivery Method: Circle System Utilized Preoxygenation: Pre-oxygenation with 100% oxygen Intubation Type: IV induction Ventilation: Mask ventilation without difficulty LMA: LMA inserted LMA Size: 4.0 Number of attempts: 1 Airway Equipment and Method: bite block Placement Confirmation: positive ETCO2 Tube secured with: Tape Dental Injury: Teeth and Oropharynx as per pre-operative assessment

## 2014-04-23 NOTE — H&P (Signed)
Urology History and Physical Exam  CC: Right ureter stone.   HPI:   53 year old male presents today for right ureter stone and right flank pain.  Right ureter stone.  This was discovered on CT scan 04/22/14.  It is located in the mid right ureter.  It is 6.6 mm in size. Density: 1407 HU.  It is associated with mild right hydronephrosis.  He has nausea and pain.  Pain is difficult to control with pain meds.  He is passed 1 stone on his own previously.  This is not associated with fever.  Nephrolithiasis.  This is located in the left kidney.  Is nonobstructive in nature.  It is approximately 2 mm in size.  04/23/14 UA: negative LE/N, RBC 7-10, WBC 0-2  04/22/14 Cr 1.22 eGFR 67/76 UA: small LE, negative N, large blood, RBC too numerous to count, WBC 0-2.04/23/14  Urine culture: pending    PMH: Past Medical History  Diagnosis Date  . Panic attacks   . Right ureteral stone   . Left nephrolithiasis   . History of panic attacks   . Right flank pain     PSH: Past Surgical History  Procedure Laterality Date  . Appendectomy      Allergies: No Known Allergies  Medications: Prescriptions prior to admission  Medication Sig Dispense Refill  . ALPRAZolam (XANAX) 0.5 MG tablet Take 0.5 mg by mouth 3 (three) times daily as needed.       . ondansetron (ZOFRAN) 4 MG tablet Take 1 tablet (4 mg total) by mouth every 6 (six) hours.  12 tablet  0  . oxyCODONE-acetaminophen (PERCOCET/ROXICET) 5-325 MG per tablet Take 2 tablets by mouth every 4 (four) hours as needed for severe pain.  15 tablet  0  . sertraline (ZOLOFT) 50 MG tablet Take 50 mg by mouth daily.       . tamsulosin (FLOMAX) 0.4 MG CAPS capsule Take 1 capsule (0.4 mg total) by mouth daily.  30 capsule  0  . ibuprofen (ADVIL,MOTRIN) 800 MG tablet Take 1 tablet (800 mg total) by mouth 3 (three) times daily.  21 tablet  0     Social History: History   Social History  . Marital Status: Single    Spouse Name: N/A    Number of  Children: N/A  . Years of Education: N/A   Occupational History  . Not on file.   Social History Main Topics  . Smoking status: Former Smoker -- 0.50 packs/day for 20 years    Types: Cigarettes    Quit date: 04/23/2009  . Smokeless tobacco: Current User    Types: Snuff, Chew     Comment: occasionally dips / chewtobacco  . Alcohol Use: Yes     Comment: occasional  . Drug Use: No  . Sexual Activity: Not on file   Other Topics Concern  . Not on file   Social History Narrative  . No narrative on file    Family History: History reviewed. No pertinent family history.  Review of Systems: Positive: Nausea, fatigue, chills, and flank pain on right. Negative: Fever, SOB, chest pain.  A further 10 point review of systems was negative except what is listed in the HPI.  Physical Exam: Filed Vitals:   04/23/14 1155  BP: 104/63  Pulse: 55  Temp: 96.9 F (36.1 C)  Resp: 14    General: No acute distress.  Awake. Head:  Normocephalic.  Atraumatic. ENT:  EOMI.  Mucous membranes moist Neck:  Supple.  No  lymphadenopathy. CV:  S1 present. S2 present. Regular rate. Pulmonary: Equal effort bilaterally.  Clear to auscultation bilaterally. Abdomen: Soft.  Non- tender to palpation. Skin:  Normal turgor.  No visible rash. Extremity: No gross deformity of bilateral upper extremities.  No gross deformity of    bilateral lower extremities. Neurologic: Alert. Appropriate mood.    Studies:  Recent Labs     04/22/14  1455  HGB  14.4  WBC  10.5  PLT  290    Recent Labs     04/22/14  1455  NA  141  K  3.8  CL  103  CO2  23  BUN  19  CREATININE  1.22  CALCIUM  9.0  GFRNONAA  67*  GFRAA  78*     No results found for this basename: PT, INR, APTT,  in the last 72 hours   No components found with this basename: ABG,     Assessment:  Right ureter stone. Right flank pain.  Plan: To OR for cystoscopy, right retrograde pyelogram, and right ureter stent placement.  Will  schedule staged right ureteroscopy in the future.

## 2014-04-23 NOTE — Discharge Instructions (Signed)
Kidney Stones  Kidney stones (urolithiasis) are deposits that form inside your kidneys. The intense pain is caused by the stone moving through the urinary tract. When the stone moves, the ureter goes into spasm around the stone. The stone is usually passed in the urine.   CAUSES   · A disorder that makes certain neck glands produce too much parathyroid hormone (primary hyperparathyroidism).  · A buildup of uric acid crystals, similar to gout in your joints.  · Narrowing (stricture) of the ureter.  · A kidney obstruction present at birth (congenital obstruction).  · Previous surgery on the kidney or ureters.  · Numerous kidney infections.  SYMPTOMS   · Feeling sick to your stomach (nauseous).  · Throwing up (vomiting).  · Blood in the urine (hematuria).  · Pain that usually spreads (radiates) to the groin.  · Frequency or urgency of urination.  DIAGNOSIS   · Taking a history and physical exam.  · Blood or urine tests.  · CT scan.  · Occasionally, an examination of the inside of the urinary bladder (cystoscopy) is performed.  TREATMENT   · Observation.  · Increasing your fluid intake.  · Extracorporeal shock wave lithotripsy This is a noninvasive procedure that uses shock waves to break up kidney stones.  · Surgery may be needed if you have severe pain or persistent obstruction. There are various surgical procedures. Most of the procedures are performed with the use of small instruments. Only small incisions are needed to accommodate these instruments, so recovery time is minimized.  The size, location, and chemical composition are all important variables that will determine the proper choice of action for you. Talk to your health care provider to better understand your situation so that you will minimize the risk of injury to yourself and your kidney.   HOME CARE INSTRUCTIONS   · Drink enough water and fluids to keep your urine clear or pale yellow. This will help you to pass the stone or stone fragments.  · Strain  all urine through the provided strainer. Keep all particulate matter and stones for your health care provider to see. The stone causing the pain may be as small as a grain of salt. It is very important to use the strainer each and every time you pass your urine. The collection of your stone will allow your health care provider to analyze it and verify that a stone has actually passed. The stone analysis will often identify what you can do to reduce the incidence of recurrences.  · Only take over-the-counter or prescription medicines for pain, discomfort, or fever as directed by your health care provider.  · Make a follow-up appointment with your health care provider as directed.  · Get follow-up X-rays if required. The absence of pain does not always mean that the stone has passed. It may have only stopped moving. If the urine remains completely obstructed, it can cause loss of kidney function or even complete destruction of the kidney. It is your responsibility to make sure X-rays and follow-ups are completed. Ultrasounds of the kidney can show blockages and the status of the kidney. Ultrasounds are not associated with any radiation and can be performed easily in a matter of minutes.  SEEK MEDICAL CARE IF:  · You experience pain that is progressive and unresponsive to any pain medicine you have been prescribed.  SEEK IMMEDIATE MEDICAL CARE IF:   · Pain cannot be controlled with the prescribed medicine.  · You have a fever   or shaking chills.  · The severity or intensity of pain increases over 18 hours and is not relieved by pain medicine.  · You develop a new onset of abdominal pain.  · You feel faint or pass out.  · You are unable to urinate.  MAKE SURE YOU:   · Understand these instructions.  · Will watch your condition.  · Will get help right away if you are not doing well or get worse.  Document Released: 11/19/2005 Document Revised: 07/22/2013 Document Reviewed: 04/22/2013  [employer]® Patient Information ©2014  [employer].

## 2014-04-23 NOTE — Discharge Instructions (Signed)
DISCHARGE INSTRUCTIONS FOR KIDNEY STONES OR URETERAL STENT  MEDICATIONS:   1.Resume all your other meds from home.  ACTIVITY 1. No strenuous activity x 1week 2. No driving while on narcotic pain medications 3. Drink plenty of water 4. Continue to walk at home - you can still get blood clots when you are at home, so keep active, but don't over do it. 5. May return to work in 3 days.  BATHING 1. You can shower. 2. If you have a string coming from your urethra:  The stent string is attached to your ureteral stent.  Do not pull on this.  If the stent gets pulled our partially before it is time to remove it, go ahead and remove the entire stent.  Call if you develop significant pain that lasts more than an hour.    SIGNS/SYMPTOMS TO CALL: 1. Please call Derek [name] if you have a fever greater than 101.5, uncontrolled  nausea/vomiting, uncontrolled pain, dizziness, unable to urinate, chest pain, shortness of breath, leg swelling, leg pain, redness around wound, drainage from wound, or any other concerns or questions.  You can reach Derek [name] at [phone]  [facility] [phone] Post Ureteroscopy With or Without Stent Instructions  Definitions:  Ureter: The duct that transports urine from the kidney to the bladder. Stent:   A plastic hollow tube that is placed into the ureter, from the kidney to the                 bladder to prevent the ureter from swelling shut.  GENERAL INSTRUCTIONS:  Despite the fact that no skin incisions were used, the area around the ureter and bladder is raw and irritated. The stent is a foreign body which will further irritate the bladder wall. This irritation is manifested by increased frequency of urination, both day and night, and by an increase in the urge to urinate. In some, the urge to urinate is present almost always. Sometimes the urge is strong enough that you may not be able to stop yourself from urinating. The only real cure is to remove the  stent and then give time for the bladder wall to heal which can't be done until the danger of the ureter swelling shut has passed, which varies.  You may see some blood in your urine while the stent is in place and a few days afterwards. Do not be alarmed, even if the urine was clear for a while. Get off your feet and drink lots of fluids until clearing occurs. If you start to pass clots or don't improve, call Derek [name].  DIET: You may return to your normal diet immediately. Because of the raw surface of your bladder, alcohol, spicy foods, acid type foods and drinks with caffeine may cause irritation or frequency and should be used in moderation. To keep your urine flowing freely and to avoid constipation, drink plenty of fluids during the day ( 8-10 glasses ). Tip: Avoid cranberry juice because it is very acidic.  ACTIVITY: Your physical activity doesn't need to be restricted. However, if you are very active, you may see some blood in your urine. We suggest that you reduce your activity under these circumstances until the bleeding has stopped.  BOWELS: It is important to keep your bowels regular during the postoperative period. Straining with bowel movements can cause bleeding. A bowel movement every other day is reasonable. Use a mild laxative if needed, such as Milk of Magnesia 2-3 tablespoons, or 2 Dulcolax tablets. Call if you  continue to have problems. If you have been taking narcotics for pain, before, during or after your surgery, you may be constipated. Take a laxative if necessary.   MEDICATION: You should resume your pre-surgery medications unless told not to. In addition you will often be given an antibiotic to prevent infection. These should be taken as prescribed until the bottles are finished unless you are having an unusual reaction to one of the drugs.  PROBLEMS YOU SHOULD REPORT TO US:  Fevers over 100.5 Fahrenheit.  Heavy bleeding, or clots ( See above notes about blood in urine  ).  Inability to urinate.  Drug reactions ( hives, rash, nausea, vomiting, diarrhea ).  Severe burning or pain with urination that is not improving.  FOLLOW-UP: You will need a follow-up appointment to monitor your progress. Call for this appointment at the number listed above. Usually the first appointment will be about three to fourteen days after your surgery.        Post Anesthesia Home Care Instructions  Activity: Get plenty of rest for the remainder of the day. A responsible adult should stay with you for 24 hours following the procedure.  For the next 24 hours, DO NOT: -Drive a car -Advertising copywriterperate machinery -Drink alcoholic beverages -Take any medication unless instructed by your physician -Make any legal decisions or sign important papers.  Meals: Start with liquid foods such as gelatin or soup. Progress to regular foods as tolerated. Avoid greasy, spicy, heavy foods. If nausea and/or vomiting occur, drink only clear liquids until the nausea and/or vomiting subsides. Call your physician if vomiting continues.  Special Instructions/Symptoms: Your throat may feel dry or sore from the anesthesia or the breathing tube placed in your throat during surgery. If this causes discomfort, gargle with warm salt water. The discomfort should disappear within 24 hours.

## 2014-04-23 NOTE — ED Notes (Signed)
Pt wheeled to car. Pt stated that urology cancelled his appt scheduled for 9am. Pt began vomiting at car. Marlon Pel, [facility] made aware, states bring pt back and she will ave urology see pt in ED.

## 2014-04-23 NOTE — ED Notes (Signed)
Family at bedside. 

## 2014-04-23 NOTE — ED Notes (Signed)
Pt c/o kidney stones in right kidney. Pt was seen here for similar complaints yesterday and dx with stones. Pt was given script of percocet, pt states he was been throwing up pain medication. Pt unable to manage pain at home

## 2014-04-23 NOTE — Anesthesia Postprocedure Evaluation (Signed)
  Anesthesia Post-op Note  Patient: Derek [name]  Procedure(s) Performed: Procedure(s) (LRB): CYSTOSCOPY  RIGHT RETROGRADE PYELOGRAM/RIGHT URETERAL STENT PLACEMENT (Right)  Patient Location: PACU  Anesthesia Type: General  Level of Consciousness: awake and alert   Airway and Oxygen Therapy: Patient Spontanous Breathing  Post-op Pain: mild  Post-op Assessment: Post-op Vital signs reviewed, Patient's Cardiovascular Status Stable, Respiratory Function Stable, Patent Airway and No signs of Nausea or vomiting  Last Vitals:  Filed Vitals:   04/23/14 1345  BP: 100/70  Pulse: 77  Temp:   Resp: 20    Post-op Vital Signs: stable   Complications: No apparent anesthesia complications

## 2014-04-23 NOTE — Anesthesia Preprocedure Evaluation (Addendum)
Anesthesia Evaluation  Patient identified by MRN, date of birth, ID band Patient awake    Reviewed: Allergy & Precautions, H&P , NPO status   Airway Mallampati: II TM Distance: >3 FB Neck ROM: Full  Mouth opening: Limited Mouth Opening  Dental  (+) Teeth Intact, Caps, Dental Advisory Given,    Pulmonary neg pulmonary ROS, former smoker,    Pulmonary exam normal       Cardiovascular Exercise Tolerance: Good Rhythm:Regular Rate:Bradycardia     Neuro/Psych Anxiety negative neurological ROS     GI/Hepatic negative GI ROS, Neg liver ROS,   Endo/Other  negative endocrine ROS  Renal/GU Renal diseaseRenal calculus     Musculoskeletal negative musculoskeletal ROS (+)   Abdominal   Peds  Hematology Factor V deficiency   Anesthesia Other Findings   Reproductive/Obstetrics                        Anesthesia Physical  Anesthesia Plan  ASA: II  Anesthesia Plan: General   Post-op Pain Management:    Induction: Intravenous  Airway Management Planned: LMA  Additional Equipment:   Intra-op Plan:   Post-operative Plan: Extubation in OR  Informed Consent: I have reviewed the patients History and Physical, chart, labs and discussed the procedure including the risks, benefits and alternatives for the proposed anesthesia with the patient or authorized representative who has indicated his/her understanding and acceptance.   Dental advisory given  Plan Discussed with: CRNA  Anesthesia Plan Comments:         Anesthesia Quick Evaluation  

## 2014-04-26 NOTE — ED Provider Notes (Signed)
Medical screening examination/treatment/procedure(s) were conducted as a shared visit with non-physician practitioner(s) or resident and myself. I personally evaluated the patient during the encounter and agree with the findings and plan unless otherwise indicated.  I have personally reviewed any xrays and/ or EKG's with the provider and I agree with interpretation.  Patient is known right-sided kidney stone in who has followup with urology presents with similar pain and vomiting secondary to pain. Patient denies any fever and urinary symptoms. On exam patient nontoxic appearing, mild dry mucous membranes, mild right lower flank tenderness, abdomen soft nontender no distention. Patient's pain improved significantly ED. Plan to ensure close urology followup prior to discharge.  Right kidney stone, right flank pain, vomiting   Enid Skeens, MD 04/26/14 [phone]

## 2014-04-27 ENCOUNTER — Encounter (HOSPITAL_BASED_OUTPATIENT_CLINIC_OR_DEPARTMENT_OTHER): Payer: Self-pay | Admitting: *Deleted

## 2014-04-27 ENCOUNTER — Other Ambulatory Visit: Payer: Self-pay | Admitting: Urology

## 2014-04-28 ENCOUNTER — Encounter (HOSPITAL_BASED_OUTPATIENT_CLINIC_OR_DEPARTMENT_OTHER): Payer: Self-pay | Admitting: *Deleted

## 2014-04-28 NOTE — Progress Notes (Signed)
NPO AFTER MN. ARRIVE AT 2023 FOR HEPARIN INJECTION.  CURRENT LAB RESULTS IN CHART AND EPIC.  MAY TAKE PAIN RX IF NEEDED W/ SIPS OF WATER. AM DOS.

## 2014-04-29 ENCOUNTER — Encounter (HOSPITAL_BASED_OUTPATIENT_CLINIC_OR_DEPARTMENT_OTHER): Payer: Self-pay | Admitting: Urology

## 2014-04-29 NOTE — H&P (Signed)
Urology History and Physical Exam  CC: Right ureter stone.  HPI:  53 year old male presents today for right ureter stone.  This is 7 mm in size.  It is located in the right midureter.  It was associated with nausea and right flank pain.  He underwent right ureter stent placement with a 5 x 26 Polaris stent on 04/23/14.  He presents today for cystoscopy, staged right ureteroscopy, laser lithotripsy, right retrograde pyelogram, and right ureter stent exchange.  We have reviewed the risks, benefits, alternatives, and likelihood of achieving goals.  He has a history of factor V Leiden deficiency which increases his risk for thromboembolic events.  Because of this he will receive 5000 units of heparin prior to surgery.  Urine culture from the hospital on 04/22/14 was negative for growth.  PMH: Past Medical History  Diagnosis Date  . Right ureteral stone   . Left nephrolithiasis   . History of panic attacks   . Factor V Leiden   . History of DVT (deep vein thrombosis)     PSH: Past Surgical History  Procedure Laterality Date  . Appendectomy    . Cysto/  right retrograde pyelogram/  right ureteral stent placement  04-23-2014  . Cystoscopy w/ ureteral stent placement Right 04/23/2014    Procedure: CYSTOSCOPY  RIGHT RETROGRADE PYELOGRAM/RIGHT URETERAL STENT PLACEMENT;  Surgeon: Magdalene Mollyaniel Y Ellerie Arenz, MD;  [facility] [place];  Service: Urology;  Laterality: Right;    Allergies: No Known Allergies  Medications: No prescriptions prior to admission     Social History: History   Social History  . Marital Status: Single    Spouse Name: N/A    Number of Children: N/A  . Years of Education: N/A   Occupational History  . Not on file.   Social History Main Topics  . Smoking status: Former Smoker -- 0.50 packs/day for 20 years    Types: Cigarettes    Quit date: 04/23/2009  . Smokeless tobacco: Current User    Types: Snuff, Chew     Comment: occasionally dips /  chewtobacco  . Alcohol Use: Yes     Comment: occasional  . Drug Use: No  . Sexual Activity: Not on file   Other Topics Concern  . Not on file   Social History Narrative  . No narrative on file    Family History: History reviewed. No pertinent family history.  Review of Systems: Positive: Right flank pain. Negative: Chest pain, SOB, or fever.  A further 10 point review of systems was negative except what is listed in the HPI.  Physical Exam: Filed Vitals:   05/03/14 0609  BP: 115/83  Pulse: 60  Temp: 97.1 F (36.2 C)  Resp: 14    General: No acute distress.  Awake. Head:  Normocephalic.  Atraumatic. ENT:  EOMI.  Mucous membranes moist Neck:  Supple.  No lymphadenopathy. CV:  S1 present. S2 present. Regular rate. Pulmonary: Equal effort bilaterally.  Clear to auscultation bilaterally. Abdomen: Soft.  Non- tender to palpation. Skin:  Normal turgor.  No visible rash. Extremity: No gross deformity of bilateral upper extremities.  No gross deformity of    bilateral lower extremities. Neurologic: Alert. Appropriate mood.   Studies:  No results found for this basename: HGB, WBC, PLT,  in the last 72 hours  No results found for this basename: NA, K, CL, CO2, BUN, CREATININE, CALCIUM, MAGNESIUM, GFRNONAA, GFRAA,  in the last 72 hours   No results found for this basename: PT, INR,  APTT,  in the last 72 hours   No components found with this basename: ABG,     Assessment:  Right ureter stone.  Plan: To OR for cystoscopy, staged right ureteroscopy, laser lithotripsy, right retrograde pyelogram, and right ureter stent exchange.

## 2014-05-03 ENCOUNTER — Ambulatory Visit (HOSPITAL_BASED_OUTPATIENT_CLINIC_OR_DEPARTMENT_OTHER): Payer: BC Managed Care – PPO | Admitting: Anesthesiology

## 2014-05-03 ENCOUNTER — Ambulatory Visit (HOSPITAL_BASED_OUTPATIENT_CLINIC_OR_DEPARTMENT_OTHER)
Admission: RE | Admit: 2014-05-03 | Discharge: 2014-05-03 | Disposition: A | Discharge status: Home / Self Care | Payer: BC Managed Care – PPO | Source: Ambulatory Visit | Attending: Urology | Admitting: Urology

## 2014-05-03 ENCOUNTER — Encounter (HOSPITAL_BASED_OUTPATIENT_CLINIC_OR_DEPARTMENT_OTHER): Payer: Self-pay | Admitting: *Deleted

## 2014-05-03 ENCOUNTER — Encounter (HOSPITAL_BASED_OUTPATIENT_CLINIC_OR_DEPARTMENT_OTHER): Payer: BC Managed Care – PPO | Admitting: Anesthesiology

## 2014-05-03 ENCOUNTER — Encounter (HOSPITAL_BASED_OUTPATIENT_CLINIC_OR_DEPARTMENT_OTHER)
Admission: RE | Disposition: A | Discharge status: Home / Self Care | Payer: Self-pay | Source: Ambulatory Visit | Attending: Urology

## 2014-05-03 DIAGNOSIS — Z86718 Personal history of other venous thrombosis and embolism: Secondary | ICD-10-CM | POA: Insufficient documentation

## 2014-05-03 DIAGNOSIS — F411 Generalized anxiety disorder: Secondary | ICD-10-CM | POA: Insufficient documentation

## 2014-05-03 DIAGNOSIS — Z87891 Personal history of nicotine dependence: Secondary | ICD-10-CM | POA: Insufficient documentation

## 2014-05-03 DIAGNOSIS — N201 Calculus of ureter: Secondary | ICD-10-CM | POA: Insufficient documentation

## 2014-05-03 DIAGNOSIS — D6859 Other primary thrombophilia: Secondary | ICD-10-CM | POA: Insufficient documentation

## 2014-05-03 HISTORY — PX: HOLMIUM LASER APPLICATION: SHX5852

## 2014-05-03 HISTORY — DX: Personal history of other venous thrombosis and embolism: Z86.718

## 2014-05-03 HISTORY — PX: CYSTOSCOPY WITH RETROGRADE PYELOGRAM, URETEROSCOPY AND STENT PLACEMENT: SHX5789

## 2014-05-03 HISTORY — DX: Activated protein C resistance: D68.51

## 2014-05-03 SURGERY — CYSTOURETEROSCOPY, WITH RETROGRADE PYELOGRAM AND STENT INSERTION
Anesthesia: General | Site: Ureter | Laterality: Right

## 2014-05-03 MED ORDER — LIDOCAINE HCL 2 % EX GEL
CUTANEOUS | Status: DC | PRN
  Administered 2014-05-03: 1 via URETHRAL

## 2014-05-03 MED ORDER — LACTATED RINGERS IV SOLN
INTRAVENOUS | Status: DC
  Filled 2014-05-03: qty 1000

## 2014-05-03 MED ORDER — OXYCODONE-ACETAMINOPHEN 5-325 MG PO TABS: 1.0000 | ORAL_TABLET | ORAL | Status: DC | PRN

## 2014-05-03 MED ORDER — IOHEXOL 350 MG/ML SOLN
INTRAVENOUS | Status: DC | PRN
Start: 2014-05-03 — End: 2014-05-03
  Administered 2014-05-03: 6 mL via INTRAVENOUS

## 2014-05-03 MED ORDER — FENTANYL CITRATE 0.05 MG/ML IJ SOLN
25.0000 ug | INTRAMUSCULAR | Status: DC | PRN
  Filled 2014-05-03: qty 1

## 2014-05-03 MED ORDER — LACTATED RINGERS IV SOLN
INTRAVENOUS | Status: DC
  Administered 2014-05-03: 06:00:00 via INTRAVENOUS
  Filled 2014-05-03: qty 1000

## 2014-05-03 MED ORDER — EPHEDRINE SULFATE 50 MG/ML IJ SOLN
INTRAMUSCULAR | Status: DC | PRN
  Administered 2014-05-03: 10 mg via INTRAVENOUS

## 2014-05-03 MED ORDER — BELLADONNA ALKALOIDS-OPIUM 16.2-60 MG RE SUPP
RECTAL | Status: AC
  Filled 2014-05-03: qty 1

## 2014-05-03 MED ORDER — FENTANYL CITRATE 0.05 MG/ML IJ SOLN
INTRAMUSCULAR | Status: DC | PRN
  Administered 2014-05-03 (×2): 50 ug via INTRAVENOUS

## 2014-05-03 MED ORDER — PROPOFOL INFUSION 10 MG/ML OPTIME
INTRAVENOUS | Status: DC | PRN
  Administered 2014-05-03: 60 mL via INTRAVENOUS
  Administered 2014-05-03: 200 mL via INTRAVENOUS

## 2014-05-03 MED ORDER — DEXAMETHASONE SODIUM PHOSPHATE 10 MG/ML IJ SOLN
INTRAMUSCULAR | Status: DC | PRN
  Administered 2014-05-03: 10 mg via INTRAVENOUS

## 2014-05-03 MED ORDER — MEPERIDINE HCL 25 MG/ML IJ SOLN
6.2500 mg | INTRAMUSCULAR | Status: DC | PRN
  Filled 2014-05-03: qty 1

## 2014-05-03 MED ORDER — MIDAZOLAM HCL 5 MG/5ML IJ SOLN
INTRAMUSCULAR | Status: DC | PRN
  Administered 2014-05-03: 2 mg via INTRAVENOUS

## 2014-05-03 MED ORDER — ONDANSETRON HCL 4 MG/2ML IJ SOLN
INTRAMUSCULAR | Status: DC | PRN
  Administered 2014-05-03: 4 mg via INTRAVENOUS

## 2014-05-03 MED ORDER — FENTANYL CITRATE 0.05 MG/ML IJ SOLN
INTRAMUSCULAR | Status: AC
  Filled 2014-05-03: qty 4

## 2014-05-03 MED ORDER — PROMETHAZINE HCL 25 MG/ML IJ SOLN
6.2500 mg | INTRAMUSCULAR | Status: DC | PRN
  Filled 2014-05-03: qty 1

## 2014-05-03 MED ORDER — SODIUM CHLORIDE 0.9 % IV SOLN
INTRAVENOUS | Status: DC | PRN
  Administered 2014-05-03: 1000 mL via INTRAMUSCULAR

## 2014-05-03 MED ORDER — LIDOCAINE HCL (CARDIAC) 20 MG/ML IV SOLN
INTRAVENOUS | Status: DC | PRN
  Administered 2014-05-03: 100 mg via INTRAVENOUS

## 2014-05-03 MED ORDER — BELLADONNA ALKALOIDS-OPIUM 16.2-60 MG RE SUPP
RECTAL | Status: DC | PRN
  Administered 2014-05-03: 1 via RECTAL

## 2014-05-03 MED ORDER — MIDAZOLAM HCL 2 MG/2ML IJ SOLN
INTRAMUSCULAR | Status: AC
  Filled 2014-05-03: qty 2

## 2014-05-03 MED ORDER — HEPARIN SODIUM (PORCINE) 5000 UNIT/ML IJ SOLN
5000.0000 [IU] | INTRAMUSCULAR | Status: AC
  Administered 2014-05-03: 5000 [IU] via SUBCUTANEOUS
  Filled 2014-05-03: qty 1

## 2014-05-03 MED ORDER — CEPHALEXIN 500 MG PO CAPS: 500.0000 mg | ORAL_CAPSULE | Freq: Three times a day (TID) | ORAL | Status: DC

## 2014-05-03 MED ORDER — GLYCOPYRROLATE 0.2 MG/ML IJ SOLN
INTRAMUSCULAR | Status: DC | PRN
  Administered 2014-05-03: 0.2 mg via INTRAVENOUS

## 2014-05-03 MED ORDER — ACETAMINOPHEN 10 MG/ML IV SOLN
INTRAVENOUS | Status: DC | PRN
  Administered 2014-05-03: 1000 mg via INTRAVENOUS

## 2014-05-03 MED ORDER — HEPARIN SODIUM (PORCINE) 5000 UNIT/ML IJ SOLN
INTRAMUSCULAR | Status: AC
  Filled 2014-05-03: qty 1

## 2014-05-03 MED ORDER — SODIUM CHLORIDE 0.9 % IR SOLN
Status: DC | PRN
  Administered 2014-05-03: 3000 mL via INTRAVESICAL

## 2014-05-03 MED ORDER — CEFAZOLIN SODIUM-DEXTROSE 2-3 GM-% IV SOLR
2.0000 g | INTRAVENOUS | Status: AC
  Administered 2014-05-03: 2 g via INTRAVENOUS
  Filled 2014-05-03: qty 50

## 2014-05-03 SURGICAL SUPPLY — 46 items
BAG DRAIN URO-CYSTO SKYTR STRL (DRAIN) IMPLANT
BASKET LASER NITINOL 1.9FR (BASKET) IMPLANT
BASKET STNLS GEMINI 4WIRE 3FR (BASKET) IMPLANT
BASKET STONE 1.7 NGAGE (UROLOGICAL SUPPLIES) ×2 IMPLANT
BASKET ZERO TIP NITINOL 2.4FR (BASKET) IMPLANT
CANISTER SUCT LVC 12 LTR MEDI- (MISCELLANEOUS) ×2 IMPLANT
CATH CLEAR GEL 3F BACKSTOP (CATHETERS) IMPLANT
CATH INTERMIT  6FR 70CM (CATHETERS) ×2 IMPLANT
CATH URET 5FR 28IN CONE TIP (BALLOONS)
CATH URET 5FR 28IN OPEN ENDED (CATHETERS) IMPLANT
CATH URET 5FR 70CM CONE TIP (BALLOONS) IMPLANT
CATH URET DUAL LUMEN 6-10FR 50 (CATHETERS) ×2 IMPLANT
CLOTH BEACON ORANGE TIMEOUT ST (SAFETY) ×2 IMPLANT
DRAPE CAMERA CLOSED 9X96 (DRAPES) ×2 IMPLANT
ELECT REM PT RETURN 9FT ADLT (ELECTROSURGICAL)
ELECTRODE REM PT RTRN 9FT ADLT (ELECTROSURGICAL) IMPLANT
FIBER LASER FLEXIVA 200 (UROLOGICAL SUPPLIES) IMPLANT
FIBER LASER FLEXIVA 365 (UROLOGICAL SUPPLIES) IMPLANT
FIBER LASER TRAC TIP (UROLOGICAL SUPPLIES) ×2 IMPLANT
GLOVE BIO SURGEON STRL SZ7 (GLOVE) ×2 IMPLANT
GLOVE BIOGEL M STER SZ 6 (GLOVE) ×2 IMPLANT
GLOVE BIOGEL PI IND STRL 6.5 (GLOVE) ×2 IMPLANT
GLOVE BIOGEL PI INDICATOR 6.5 (GLOVE) ×2
GLOVE ECLIPSE 7.0 STRL STRAW (GLOVE) IMPLANT
GLOVE INDICATOR 7.5 STRL GRN (GLOVE) IMPLANT
GOWN PREVENTION PLUS LG XLONG (DISPOSABLE) IMPLANT
GOWN STRL REUS W/TWL LRG LVL3 (GOWN DISPOSABLE) ×2 IMPLANT
GOWN STRL REUS W/TWL XL LVL3 (GOWN DISPOSABLE) ×2 IMPLANT
GUIDEWIRE 0.038 PTFE COATED (WIRE) IMPLANT
GUIDEWIRE ANG ZIPWIRE 038X150 (WIRE) IMPLANT
GUIDEWIRE STR DUAL SENSOR (WIRE) ×2 IMPLANT
IV NS 1000ML (IV SOLUTION) ×1
IV NS 1000ML BAXH (IV SOLUTION) ×1 IMPLANT
IV NS IRRIG 3000ML ARTHROMATIC (IV SOLUTION) ×2 IMPLANT
KIT BALLIN UROMAX 15FX10 (LABEL) IMPLANT
KIT BALLN UROMAX 15FX4 (MISCELLANEOUS) IMPLANT
KIT BALLN UROMAX 26 75X4 (MISCELLANEOUS)
NS IRRIG 500ML POUR BTL (IV SOLUTION) ×2 IMPLANT
PACK CYSTOSCOPY (CUSTOM PROCEDURE TRAY) ×2 IMPLANT
SET HIGH PRES BAL DIL (LABEL)
SHEATH ACCESS URETERAL 38CM (SHEATH) ×2 IMPLANT
SHEATH ACCESS URETERAL 54CM (SHEATH) IMPLANT
SHEATH URET ACCESS 12FR/35CM (UROLOGICAL SUPPLIES) IMPLANT
SHEATH URET ACCESS 12FR/55CM (UROLOGICAL SUPPLIES) IMPLANT
STENT POLARIS 5FRX26 (STENTS) ×2 IMPLANT
SYRINGE IRR TOOMEY STRL 70CC (SYRINGE) IMPLANT

## 2014-05-03 NOTE — Discharge Instructions (Signed)
DISCHARGE INSTRUCTIONS FOR KIDNEY STONES OR URETERAL STENT  MEDICATIONS:   1.  Resume all your other meds from home.  ACTIVITY 1. No strenuous activity x 1week 2. No driving while on narcotic pain medications 3. Drink plenty of water 4. Continue to walk at home - you can still get blood clots when you are at home, so keep active, but don't over do it. 5. May return to work in 3 days.  BATHING 1. You can shower. 2. If you have a string coming from your urethra:  The stent string is attached to your ureteral stent.  Do not pull on this.  If the stent gets pulled our partially before it is time to remove it, go ahead and remove the entire stent.  Call if you develop significant pain that lasts more than an hour.    SIGNS/SYMPTOMS TO CALL: 1. Please call us if you have a fever greater than 101.5, uncontrolled  nausea/vomiting, uncontrolled pain, dizziness, unable to urinate, chest pain, shortness of breath, leg swelling, leg pain, redness around wound, drainage from wound, or any other concerns or questions.  You can reach [place] at [phone].  FOLLOW-UP If you have a string attached to your stent, you may remove it on Thursday, 05/06/14.  To do this, pull the string until the stent is completely removed.  You may feel an odd sensation in your back. Post Anesthesia Home Care Instructions  Activity: Get plenty of rest for the remainder of the day. A responsible adult should stay with you for 24 hours following the procedure.  For the next 24 hours, DO NOT: -Drive a car -Advertising copywriter -Drink alcoholic beverages -Take any medication unless instructed by your physician -Make any legal decisions or sign important papers.  Meals: Start with liquid foods such as gelatin or soup. Progress to regular foods as tolerated. Avoid greasy, spicy, heavy foods. If nausea and/or vomiting occur, drink only clear liquids until the nausea and/or vomiting subsides. Call your physician if vomiting  continues.  Special Instructions/Symptoms: Your throat may feel dry or sore from the anesthesia or the breathing tube placed in your throat during surgery. If this causes discomfort, gargle with warm salt water. The discomfort should disappear within 24 hours. 1.

## 2014-05-03 NOTE — Anesthesia Procedure Notes (Signed)
Procedure Name: LMA Insertion Date/Time: 05/03/2014 7:31 AM Performed by: Tyrone Nine Pre-anesthesia Checklist: Patient identified, Timeout performed, Emergency Drugs available, Suction available and Patient being monitored Patient Re-evaluated:Patient Re-evaluated prior to inductionOxygen Delivery Method: Circle system utilized Preoxygenation: Pre-oxygenation with 100% oxygen Intubation Type: IV induction Ventilation: Mask ventilation without difficulty LMA: LMA inserted LMA Size: 4.0 Number of attempts: 1 Placement Confirmation: positive ETCO2 Tube secured with: Tape Dental Injury: Teeth and Oropharynx as per pre-operative assessment

## 2014-05-03 NOTE — Transfer of Care (Signed)
Immediate Anesthesia Transfer of Care Note  Patient: Derek [name]  Procedure(s) Performed: Procedure(s): CYSTOSCOPY WITH RETROGRADE PYELOGRAM, URETEROSCOPY AND STENT EXCHANGE (Right) HOLMIUM LASER APPLICATION (Right)  Patient Location: PACU  Anesthesia Type:General  Level of Consciousness: awake, alert , oriented and patient cooperative  Airway & Oxygen Therapy: Patient Spontanous Breathing and Patient connected to nasal cannula oxygen  Post-op Assessment: Report given to PACU RN and Post -op Vital signs reviewed and stable  Post vital signs: Reviewed and stable  Complications: No apparent anesthesia complications

## 2014-05-03 NOTE — Anesthesia Postprocedure Evaluation (Signed)
  Anesthesia Post-op Note  Patient: Derek [name]  Procedure(s) Performed: Procedure(s) (LRB): CYSTOSCOPY WITH RETROGRADE PYELOGRAM, URETEROSCOPY AND STENT EXCHANGE (Right) HOLMIUM LASER APPLICATION (Right)  Patient Location: PACU  Anesthesia Type: General  Level of Consciousness: awake and alert   Airway and Oxygen Therapy: Patient Spontanous Breathing  Post-op Pain: mild  Post-op Assessment: Post-op Vital signs reviewed, Patient's Cardiovascular Status Stable, Respiratory Function Stable, Patent Airway and No signs of Nausea or vomiting  Last Vitals:  Filed Vitals:   05/03/14 0941  BP: 126/82  Pulse: 54  Temp: 36 C  Resp: 18    Post-op Vital Signs: stable   Complications: No apparent anesthesia complications

## 2014-05-03 NOTE — Anesthesia Preprocedure Evaluation (Addendum)
Anesthesia Evaluation  Patient identified by MRN, date of birth, ID band Patient awake    Reviewed: Allergy & Precautions, H&P , NPO status   Airway Mallampati: II TM Distance: >3 FB Neck ROM: Full  Mouth opening: Limited Mouth Opening  Dental  (+) Teeth Intact, Caps, Dental Advisory Given,    Pulmonary neg pulmonary ROS, former smoker,    Pulmonary exam normal       Cardiovascular Exercise Tolerance: Good Rhythm:Regular Rate:Bradycardia     Neuro/Psych Anxiety negative neurological ROS     GI/Hepatic negative GI ROS, Neg liver ROS,   Endo/Other  negative endocrine ROS  Renal/GU Renal diseaseRenal calculus     Musculoskeletal negative musculoskeletal ROS (+)   Abdominal   Peds  Hematology Factor V deficiency   Anesthesia Other Findings   Reproductive/Obstetrics                        Anesthesia Physical  Anesthesia Plan  ASA: II  Anesthesia Plan: General   Post-op Pain Management:    Induction: Intravenous  Airway Management Planned: LMA  Additional Equipment:   Intra-op Plan:   Post-operative Plan: Extubation in OR  Informed Consent: I have reviewed the patients History and Physical, chart, labs and discussed the procedure including the risks, benefits and alternatives for the proposed anesthesia with the patient or authorized representative who has indicated his/her understanding and acceptance.   Dental advisory given  Plan Discussed with: CRNA  Anesthesia Plan Comments:         Anesthesia Quick Evaluation

## 2014-05-03 NOTE — Op Note (Signed)
Urology Operative Report  Date of Procedure: 05/03/14  Surgeon: Natalia Leatherwoodaniel Oluwatosin Bracy, MD Assistant:  None  Preoperative Diagnosis: Right ureter stone. Postoperative Diagnosis:  Same  Procedure(s): Staged right ureteroscopy with laser lithotripsy and stone removal. Right retrograde pyelogram with interpretation. Right ureter stent removal. Right ureter stent placement (5 x 26 polaris with tether). Cystoscopy.  Estimated blood loss: None  Specimen: Stones sent to [facility] lab for chemical analysis.  Drains: None  Complications: None  Findings: Right proximal ureter stone.  Multiple Randall's plaques.  History of present illness: 53 year old male presents today for right ureter stone. He had right ureter stent placed and presents today for staged right ureteroscopy.   Procedure in detail: Prior to being taken to the operating room he arrived early and received subcutaneous heparin in preparation for surgery due to his history of factor V Leiden. After informed consent was obtained, the patient was taken to the operating room. They were placed in the supine position. SCDs were turned on and in place. IV antibiotics were infused, and general anesthesia was induced. A timeout was performed in which the correct patient, surgical site, and procedure were identified and agreed upon by the team.  The patient was placed in a dorsolithotomy position, making sure to pad all pertinent neurovascular pressure points. A belladonna and opium suppository was placed into the rectum. The genitals were prepped and draped in the usual sterile fashion.  A rigid cystoscope was passed through the urethra and into the bladder. The right ureter stent was grasped and pulled to the urethral meatus. A sensor wire was placed through this under fluoroscopy up into the right renal pelvis on fluoroscopy. This was secured as a safety wire.  I then attempted to place a semirigid ureteroscope into the ureter but this was not  successful due to some mild stenosis. I withdrew the ureter scope and then placed a dual lumen ureter access sheath over the wire under fluoroscopy with ease into the distal ureter.  I then obtained a right retrograde pyelogram by injecting 6 cc of Omnipaque through the dual-lumen ureter access sheath. There was a filling defect in the proximal ureter and some mild hydronephrosis. No other filling defects noted. There was no extravasation. This I did not drain well.  I then withdrew the dual-lumen ureter access sheath and secured the safety wire. A semirigid ureteroscope was able to pass through the urethra into the bladder and up the ureter with ease. I was not able to reach the stone since it was more proximal with the semirigid ureteroscope.  I placed a second sensor wire through the semirigid ureteroscope up into the right renal pelvis on fluoroscopy. The ureter scope was removed.  I then placed a medium length 12/14 ureter access sheath with ease into the proximal ureter over the wire under fluoroscopy. I then withdrew the obturator and working wire.  I navigated a flexible digital ureter scope through the access sheath and into the proximal ureter. The stone was visualized. Perform lithotripsy with a 200  holmium laser filament at 0.5 J and 20 Hz. The stone was broken into 3 large fragments. These were removed with a basket. The stones were sent to [facility] lab for: Analysis.  I then navigated the ureteroscope into the right kidney. All calyces were evaluated in a systematic fashion. There were noted to be multiple Randall's plaques but no free stones. I then withdrew the ureter access sheath and ureter scope and visualize the entire length of the ureter. There  was no injury to the mucosa.  I then loaded the safety wire through the cystoscope and placed a 5 x 26 Polaris stent through the scope over the wire under fluoroscopy with ease. This was deployed with a curl in the right renal  pelvis and the loops in the bladder. The tether remained in place and was secured to the penis after the bladder was drained and the cystoscope was removed.  I placed 10 cc of lidocaine jelly into the urethra. This completed the procedure. He's placed back in supine position, anesthesia was reversed, and he was taken to the PACU in stable condition.  All counts were correct at the end of the case.  He will remove the stent on his own on Thursday morning. He'll be given Keflex to start today for stent removal.

## 2014-05-05 ENCOUNTER — Encounter (HOSPITAL_BASED_OUTPATIENT_CLINIC_OR_DEPARTMENT_OTHER): Payer: Self-pay | Admitting: Urology

## 2014-05-07 ENCOUNTER — Encounter (HOSPITAL_BASED_OUTPATIENT_CLINIC_OR_DEPARTMENT_OTHER): Payer: Self-pay | Admitting: Urology

## 2014-05-07 NOTE — Transfer of Care (Signed)
Immediate Anesthesia Transfer of Care Note  Patient: Derek [name]  Procedure(s) Performed: Procedure(s) (LRB): CYSTOSCOPY  RIGHT RETROGRADE PYELOGRAM/RIGHT URETERAL STENT PLACEMENT (Right)  Patient Location: PACU  Anesthesia Type: General  Level of Consciousness: awake, oriented, sedated and patient cooperative  Airway & Oxygen Therapy: Patient Spontanous Breathing and Patient connected to face mask oxygen  Post-op Assessment: Report given to PACU RN and Post -op Vital signs reviewed and stable  Post vital signs: Reviewed and stable  Complications: No apparent anesthesia complications

## 2014-05-14 NOTE — Addendum Note (Signed)
Addendum created 05/14/14 1703 by Francie MassingLisa D Lovett Coffin, CRNA   Modules edited: Anesthesia Flowsheet

## 2014-11-24 ENCOUNTER — Ambulatory Visit (INDEPENDENT_AMBULATORY_CARE_PROVIDER_SITE_OTHER): Payer: BC Managed Care – PPO | Admitting: Emergency Medicine

## 2014-11-24 ENCOUNTER — Ambulatory Visit (INDEPENDENT_AMBULATORY_CARE_PROVIDER_SITE_OTHER): Payer: BC Managed Care – PPO

## 2014-11-24 VITALS — BP 124/82 | HR 60 | Temp 98.6°F | Resp 18 | Ht 72.0 in | Wt 225.6 lb

## 2014-11-24 DIAGNOSIS — R0789 Other chest pain: Secondary | ICD-10-CM

## 2014-11-24 DIAGNOSIS — S20211A Contusion of right front wall of thorax, initial encounter: Secondary | ICD-10-CM

## 2014-11-24 LAB — POCT CBC
Granulocyte percent: 57.5 %G (ref 37–80)
HEMATOCRIT: 44.6 % (ref 43.5–53.7)
HEMOGLOBIN: 14.6 g/dL (ref 14.1–18.1)
LYMPH, POC: 4.3 — AB (ref 0.6–3.4)
MCH, POC: 28.8 pg (ref 27–31.2)
MCHC: 32.7 g/dL (ref 31.8–35.4)
MCV: 88.1 fL (ref 80–97)
MID (cbc): 0.8 (ref 0–0.9)
MPV: 7.9 fL (ref 0–99.8)
POC GRANULOCYTE: 6.9 (ref 2–6.9)
POC LYMPH PERCENT: 35.8 %L (ref 10–50)
POC MID %: 6.7 %M (ref 0–12)
Platelet Count, POC: 306 10*3/uL (ref 142–424)
RBC: 5.06 M/uL (ref 4.69–6.13)
RDW, POC: 14.1 %
WBC: 12 10*3/uL — AB (ref 4.6–10.2)

## 2014-11-24 MED ORDER — HYDROCODONE-ACETAMINOPHEN 5-325 MG PO TABS: 1.0000 | ORAL_TABLET | ORAL | Status: DC | PRN

## 2014-11-24 NOTE — Patient Instructions (Signed)

## 2014-11-24 NOTE — Progress Notes (Signed)
[facility] [address], [place] [id number] [phone]  Date:  11/24/2014   Name:  Derek CritchleyRory B [name]   DOB:  Aug 03, 1961   MRN:  [id number]  PCP:  Garlan FillersPATERSON,DANIEL G, MD    Chief Complaint: Rib Injury   History of Present Illness:  Derek CritchleyRory B [name] is a 53 y.o. 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  He bent over a table and lost his balance and fell on the corner yesterday Has localized pain in the anterior inferior right chest wall.  Pain increases with breathing. No hemoptysis.  No shortness of breath.  No nausea or vomiting.   No abdominal pain.  The patient has no complaint of blood, mucous, or pus in her stools. Pain worse with activity or deep breathing.  Responds to rest. No improvement with over the counter medications or other home remedies.  Denies other complaint or health concern today.   There are no active problems to display for this patient.   Past Medical History  Diagnosis Date  . Right ureteral stone   . Left nephrolithiasis   . History of panic attacks   . Factor V Leiden   . History of DVT (deep vein thrombosis)     Past Surgical History  Procedure Laterality Date  . Appendectomy    . Cysto/  right retrograde pyelogram/  right ureteral stent placement  04-23-2014  . Cystoscopy with retrograde pyelogram, ureteroscopy and stent placement Right 05/03/2014    Procedure: CYSTOSCOPY WITH RETROGRADE PYELOGRAM, URETEROSCOPY AND STENT EXCHANGE;  Surgeon: Magdalene Mollyaniel Y Woodruff, MD;  [facility] [place];  Service: Urology;  Laterality: Right;  . Holmium laser application Right 05/03/2014    Procedure: HOLMIUM LASER APPLICATION;  Surgeon: Magdalene Mollyaniel Y Woodruff, MD;  [facility] [place];  Service: Urology;  Laterality: Right;  . Cystoscopy w/ ureteral stent placement Right 04/23/2014    Procedure: CYSTOSCOPY  RIGHT RETROGRADE PYELOGRAM/RIGHT URETERAL STENT  PLACEMENT;  Surgeon: Magdalene Mollyaniel Y Woodruff, MD;  [facility] [place];  Service: Urology;  Laterality: Right;    History  Substance Use Topics  . Smoking status: Former Smoker -- 0.50 packs/day for 20 years    Types: Cigarettes    Quit date: 04/23/2009  . Smokeless tobacco: Current User    Types: Snuff, Chew     Comment: occasionally dips / chewtobacco  . Alcohol Use: 0.0 oz/week    0 Not specified per week     Comment: occasional    No family history on file.  No Known Allergies  Medication list has been reviewed and updated.  Current Outpatient Prescriptions on File Prior to Visit  Medication Sig Dispense Refill  . ALPRAZolam (XANAX) 0.5 MG tablet Take 0.5 mg by mouth 3 (three) times daily as needed.     . sertraline (ZOLOFT) 50 MG tablet Take 50 mg by mouth daily.     . cephALEXin (KEFLEX) 500 MG capsule Take 1 capsule (500 mg total) by mouth 3 (three) times daily. Begin the day before you removal your stent. (Patient not taking: Reported on 11/24/2014) 9 capsule 0  . hyoscyamine (LEVSIN, ANASPAZ) 0.125 MG tablet Take 1 tablet (0.125 mg total) by mouth every 4 (four) hours as needed (bladder spasms). (Patient not taking: Reported on 11/24/2014) 40 tablet 4  . ibuprofen (ADVIL,MOTRIN) 800 MG tablet Take 1 tablet (800 mg total) by mouth 3 (three) times daily. (Patient  not taking: Reported on 11/24/2014) 21 tablet 0  . ondansetron (ZOFRAN) 4 MG tablet Take 1 tablet (4 mg total) by mouth every 6 (six) hours. (Patient not taking: Reported on 11/24/2014) 12 tablet 0  . oxyCODONE-acetaminophen (PERCOCET/ROXICET) 5-325 MG per tablet Take 1-2 tablets by mouth every 4 (four) hours as needed for severe pain. (Patient not taking: Reported on 11/24/2014) 40 tablet 0  . phenazopyridine (PYRIDIUM) 200 MG tablet Take 1 tablet (200 mg total) by mouth 3 (three) times daily as needed for pain. (Patient not taking: Reported on 11/24/2014) 30 tablet 6  . senna-docusate (SENOKOT S) 8.6-50 MG  per tablet Take 1 tablet by mouth 2 (two) times daily. (Patient not taking: Reported on 11/24/2014) 60 tablet 0  . tamsulosin (FLOMAX) 0.4 MG CAPS capsule Take 1 capsule (0.4 mg total) by mouth daily. (Patient not taking: Reported on 11/24/2014) 30 capsule 3   No current facility-administered medications on file prior to visit.    Review of Systems:  As per HPI, otherwise negative.    Physical Examination: Filed Vitals:   11/24/14 1832  BP: 124/82  Pulse: 60  Temp: 98.6 F (37 C)  Resp: 18   Filed Vitals:   11/24/14 1832  Height: 6' (1.829 m)  Weight: 225 lb 9.6 oz (102.331 kg)   Body mass index is 30.59 kg/(m^2). Ideal Body Weight: Weight in (lb) to have BMI = 25: 183.9  GEN: WDWN, NAD, Non-toxic, A & O x 3 HEENT: Atraumatic, Normocephalic. Neck supple. No masses, No LAD. Ears and Nose: No external deformity. CV: RRR, No M/G/R. No JVD. No thrill. No extra heart sounds. PULM: CTA B, no wheezes, crackles, rhonchi. No retractions. No resp. distress. No accessory muscle use. Chest wall tender with hematoma.  No crupitus or flail  ABD: S, NT, ND, +BS. No rebound. No HSM. EXTR: No c/c/e NEURO Normal gait.  PSYCH: Normally interactive. Conversant. Not depressed or anxious appearing.  Calm demeanor.    Assessment and Plan: Chest contusion vicodin Factor V  Signed,  Phillips OdorJeffery Arliss Frisina, MD   [facility] reading (PRIMARY) by  Dr. Dareen PianoAnderson.  Negative chest.

## 2018-02-13 DIAGNOSIS — I82811 Embolism and thrombosis of superficial veins of right lower extremities: Secondary | ICD-10-CM | POA: Diagnosis not present

## 2018-02-13 DIAGNOSIS — Z6831 Body mass index (BMI) 31.0-31.9, adult: Secondary | ICD-10-CM | POA: Diagnosis not present

## 2018-02-13 DIAGNOSIS — R600 Localized edema: Secondary | ICD-10-CM | POA: Diagnosis not present

## 2018-02-13 DIAGNOSIS — M7989 Other specified soft tissue disorders: Secondary | ICD-10-CM | POA: Diagnosis not present

## 2018-02-13 DIAGNOSIS — M79604 Pain in right leg: Secondary | ICD-10-CM | POA: Diagnosis not present

## 2018-04-21 DIAGNOSIS — F418 Other specified anxiety disorders: Secondary | ICD-10-CM | POA: Diagnosis not present

## 2018-04-21 DIAGNOSIS — I82401 Acute embolism and thrombosis of unspecified deep veins of right lower extremity: Secondary | ICD-10-CM | POA: Diagnosis not present

## 2018-04-21 DIAGNOSIS — R002 Palpitations: Secondary | ICD-10-CM | POA: Diagnosis not present

## 2018-04-21 DIAGNOSIS — Z7901 Long term (current) use of anticoagulants: Secondary | ICD-10-CM | POA: Diagnosis not present

## 2018-04-23 ENCOUNTER — Other Ambulatory Visit: Payer: Self-pay | Admitting: Internal Medicine

## 2018-04-23 DIAGNOSIS — R002 Palpitations: Secondary | ICD-10-CM

## 2018-04-23 DIAGNOSIS — R0609 Other forms of dyspnea: Principal | ICD-10-CM

## 2018-04-23 DIAGNOSIS — I824Y1 Acute embolism and thrombosis of unspecified deep veins of right proximal lower extremity: Secondary | ICD-10-CM

## 2018-04-24 ENCOUNTER — Ambulatory Visit
Admission: RE | Admit: 2018-04-24 | Discharge: 2018-04-24 | Disposition: A | Discharge status: Home / Self Care | Payer: BLUE CROSS/BLUE SHIELD | Source: Ambulatory Visit | Attending: Internal Medicine | Admitting: Internal Medicine

## 2018-04-24 DIAGNOSIS — R0609 Other forms of dyspnea: Principal | ICD-10-CM

## 2018-04-24 DIAGNOSIS — R002 Palpitations: Secondary | ICD-10-CM

## 2018-04-24 DIAGNOSIS — I824Y1 Acute embolism and thrombosis of unspecified deep veins of right proximal lower extremity: Secondary | ICD-10-CM

## 2018-05-05 ENCOUNTER — Ambulatory Visit
Admission: RE | Admit: 2018-05-05 | Discharge: 2018-05-05 | Disposition: A | Discharge status: Home / Self Care | Payer: BLUE CROSS/BLUE SHIELD | Source: Ambulatory Visit | Attending: Internal Medicine | Admitting: Internal Medicine

## 2018-05-05 DIAGNOSIS — I82409 Acute embolism and thrombosis of unspecified deep veins of unspecified lower extremity: Secondary | ICD-10-CM | POA: Diagnosis not present

## 2018-05-05 DIAGNOSIS — R002 Palpitations: Secondary | ICD-10-CM | POA: Diagnosis not present

## 2018-05-05 MED ORDER — IOPAMIDOL (ISOVUE-370) INJECTION 76%
100.0000 mL | Freq: Once | INTRAVENOUS | Status: AC | PRN
  Administered 2018-05-05: 100 mL via INTRAVENOUS

## 2018-05-16 DIAGNOSIS — F410 Panic disorder [episodic paroxysmal anxiety] without agoraphobia: Secondary | ICD-10-CM | POA: Diagnosis not present

## 2018-05-23 DIAGNOSIS — F410 Panic disorder [episodic paroxysmal anxiety] without agoraphobia: Secondary | ICD-10-CM | POA: Diagnosis not present

## 2018-05-30 DIAGNOSIS — F410 Panic disorder [episodic paroxysmal anxiety] without agoraphobia: Secondary | ICD-10-CM | POA: Diagnosis not present

## 2018-07-07 DIAGNOSIS — F410 Panic disorder [episodic paroxysmal anxiety] without agoraphobia: Secondary | ICD-10-CM | POA: Diagnosis not present

## 2018-07-29 ENCOUNTER — Other Ambulatory Visit: Payer: Self-pay

## 2018-07-29 ENCOUNTER — Encounter (HOSPITAL_COMMUNITY): Payer: Self-pay | Admitting: Emergency Medicine

## 2018-07-29 ENCOUNTER — Other Ambulatory Visit: Payer: Self-pay | Admitting: Physician Assistant

## 2018-07-29 ENCOUNTER — Emergency Department (HOSPITAL_COMMUNITY): Payer: BLUE CROSS/BLUE SHIELD

## 2018-07-29 ENCOUNTER — Emergency Department (HOSPITAL_COMMUNITY)
Admission: EM | Admit: 2018-07-29 | Discharge: 2018-07-29 | Disposition: A | Discharge status: Home / Self Care | Payer: BLUE CROSS/BLUE SHIELD | Attending: Emergency Medicine | Admitting: Emergency Medicine

## 2018-07-29 DIAGNOSIS — F17220 Nicotine dependence, chewing tobacco, uncomplicated: Secondary | ICD-10-CM | POA: Insufficient documentation

## 2018-07-29 DIAGNOSIS — R002 Palpitations: Secondary | ICD-10-CM | POA: Diagnosis not present

## 2018-07-29 DIAGNOSIS — R0602 Shortness of breath: Secondary | ICD-10-CM | POA: Diagnosis not present

## 2018-07-29 DIAGNOSIS — R079 Chest pain, unspecified: Secondary | ICD-10-CM | POA: Diagnosis not present

## 2018-07-29 DIAGNOSIS — Z79899 Other long term (current) drug therapy: Secondary | ICD-10-CM | POA: Diagnosis not present

## 2018-07-29 DIAGNOSIS — Z7901 Long term (current) use of anticoagulants: Secondary | ICD-10-CM | POA: Insufficient documentation

## 2018-07-29 DIAGNOSIS — I5043 Acute on chronic combined systolic (congestive) and diastolic (congestive) heart failure: Secondary | ICD-10-CM | POA: Diagnosis not present

## 2018-07-29 LAB — BASIC METABOLIC PANEL
ANION GAP: 8 (ref 5–15)
BUN: 16 mg/dL (ref 6–20)
CHLORIDE: 105 mmol/L (ref 98–111)
CO2: 25 mmol/L (ref 22–32)
Calcium: 8.7 mg/dL — ABNORMAL LOW (ref 8.9–10.3)
Creatinine, Ser: 1.14 mg/dL (ref 0.61–1.24)
Glucose, Bld: 114 mg/dL — ABNORMAL HIGH (ref 70–99)
POTASSIUM: 4.4 mmol/L (ref 3.5–5.1)
SODIUM: 138 mmol/L (ref 135–145)

## 2018-07-29 LAB — CBC
HEMATOCRIT: 46.6 % (ref 39.0–52.0)
HEMOGLOBIN: 15.3 g/dL (ref 13.0–17.0)
MCH: 29.7 pg (ref 26.0–34.0)
MCHC: 32.8 g/dL (ref 30.0–36.0)
MCV: 90.5 fL (ref 78.0–100.0)
Platelets: 279 10*3/uL (ref 150–400)
RBC: 5.15 MIL/uL (ref 4.22–5.81)
RDW: 12.7 % (ref 11.5–15.5)
WBC: 8.7 10*3/uL (ref 4.0–10.5)

## 2018-07-29 LAB — BRAIN NATRIURETIC PEPTIDE: B NATRIURETIC PEPTIDE 5: 16.3 pg/mL (ref 0.0–100.0)

## 2018-07-29 LAB — D-DIMER, QUANTITATIVE: D-Dimer, Quant: 0.38 ug/mL-FEU (ref 0.00–0.50)

## 2018-07-29 LAB — I-STAT TROPONIN, ED: Troponin i, poc: 0.01 ng/mL (ref 0.00–0.08)

## 2018-07-29 LAB — TSH: TSH: 1.851 u[IU]/mL (ref 0.350–4.500)

## 2018-07-29 NOTE — Discharge Instructions (Signed)
Follow-up with cardiology in the office tomorrow, as instructed by the cardiologist.  Return to the ED, as needed, for worsening symptoms.

## 2018-07-29 NOTE — ED Provider Notes (Addendum)
[facility] Provider Note   CSN: [id number] Arrival date & time: 07/29/18  1022     History   Chief Complaint Chief Complaint  Patient presents with  . Shortness of Breath    HPI Derek [name] is a 57 y.o. male who  has a past medical history of Factor V Leiden (HCC), History of DVT (deep vein thrombosis), History of panic attacks, Left nephrolithiasis, and Right ureteral stone.  Presents the emergency department with chief complaint of this of breath.  Patient is on Eliquis and is compliant with medications daily.  He states that since early June he has had episodes of exertional dyspnea.  Patient states that some days are better than others however nearly every day he has shortness of breath which is related to exerting himself and relieved when he stops to rest.  He states that some days are completely debilitating and it is difficult for him to even get to his car get through his workday.  He is attributed this to his ongoing anxiety and followed up both with his PCP as well as his psychiatrist.  The patient also notes that he had a CT angios in early June that was negative for any blood clots and he remains compliant with his medications.  He states also that his dyspnea has remained unchanged since that previous evaluation.  The patient states that he notices his shortness of breath when he first wakes up and states that he feels terrible that he feels like he is being smothered that he cannot get a full breath but it gets better once he gets up and showers and takes his medications.  He denies unilateral leg swelling, hemoptysis, fevers.   HPI  Past Medical History:  Diagnosis Date  . Factor V Leiden (HCC)   . History of DVT (deep vein thrombosis)   . History of panic attacks   . Left nephrolithiasis   . Right ureteral stone     There are no active problems to display for this patient.   Past Surgical History:  Procedure Laterality  Date  . APPENDECTOMY    . CYSTO/  RIGHT RETROGRADE PYELOGRAM/  RIGHT URETERAL STENT PLACEMENT  04-23-2014  . CYSTOSCOPY W/ URETERAL STENT PLACEMENT Right 04/23/2014   Procedure: CYSTOSCOPY  RIGHT RETROGRADE PYELOGRAM/RIGHT URETERAL STENT PLACEMENT;  Surgeon: Magdalene Mollyaniel Y Woodruff, MD;  [facility] [place];  Service: Urology;  Laterality: Right;  . CYSTOSCOPY WITH RETROGRADE PYELOGRAM, URETEROSCOPY AND STENT PLACEMENT Right 05/03/2014   Procedure: CYSTOSCOPY WITH RETROGRADE PYELOGRAM, URETEROSCOPY AND STENT EXCHANGE;  Surgeon: Magdalene Mollyaniel Y Woodruff, MD;  [facility] [place];  Service: Urology;  Laterality: Right;  . HOLMIUM LASER APPLICATION Right 05/03/2014   Procedure: HOLMIUM LASER APPLICATION;  Surgeon: Magdalene Mollyaniel Y Woodruff, MD;  [facility] [place];  Service: Urology;  Laterality: Right;        Home Medications    Prior to Admission medications   Medication Sig Start Date End Date Taking? Authorizing Provider  ALPRAZolam Prudy Feeler(XANAX) 0.5 MG tablet Take 0.5 mg by mouth 3 (three) times daily as needed.  12/08/13  Yes [provider]  ELIQUIS 5 MG TABS tablet Take 5 mg by mouth 2 (two) times daily. 06/24/18  Yes [provider]  sertraline (ZOLOFT) 50 MG tablet Take 50 mg by mouth daily.  12/08/13  Yes [provider]  cephALEXin (KEFLEX) 500 MG capsule Take 1 capsule (500 mg total) by mouth 3 (three) times daily.  Begin the day before you removal your stent. Patient not taking: Reported on 11/24/2014 05/03/14   Natalia Leatherwood, MD  HYDROcodone-acetaminophen [facility]) 5-325 MG per tablet Take 1-2 tablets by mouth every 4 (four) hours as needed. Patient not taking: Reported on 07/29/2018 11/24/14   Carmelina Dane, MD  hyoscyamine (LEVSIN, ANASPAZ) 0.125 MG tablet Take 1 tablet (0.125 mg total) by mouth every 4 (four) hours as needed (bladder spasms). Patient not taking: Reported on 11/24/2014 04/23/14   Natalia Leatherwood, MD  ibuprofen  (ADVIL,MOTRIN) 800 MG tablet Take 1 tablet (800 mg total) by mouth 3 (three) times daily. Patient not taking: Reported on 11/24/2014 04/22/14   Glynn Octave, MD  ondansetron (ZOFRAN) 4 MG tablet Take 1 tablet (4 mg total) by mouth every 6 (six) hours. Patient not taking: Reported on 11/24/2014 04/22/14   Glynn Octave, MD  oxyCODONE-acetaminophen (PERCOCET/ROXICET) 5-325 MG per tablet Take 1-2 tablets by mouth every 4 (four) hours as needed for severe pain. Patient not taking: Reported on 11/24/2014 05/03/14   Natalia Leatherwood, MD  phenazopyridine (PYRIDIUM) 200 MG tablet Take 1 tablet (200 mg total) by mouth 3 (three) times daily as needed for pain. Patient not taking: Reported on 11/24/2014 04/23/14   Natalia Leatherwood, MD  senna-docusate [facility]) 8.6-50 MG per tablet Take 1 tablet by mouth 2 (two) times daily. Patient not taking: Reported on 11/24/2014 04/23/14   Natalia Leatherwood, MD  tamsulosin (FLOMAX) 0.4 MG CAPS capsule Take 1 capsule (0.4 mg total) by mouth daily. Patient not taking: Reported on 11/24/2014 04/23/14   Natalia Leatherwood, MD    Family History No family history on file.  Social History Social History   Tobacco Use  . Smoking status: Former Smoker    Packs/day: 0.50    Years: 20.00    Pack years: 10.00    Types: Cigarettes    Last attempt to quit: 04/23/2009    Years since quitting: 9.2  . Smokeless tobacco: Current User    Types: Snuff, Chew  . Tobacco comment: occasionally dips / chewtobacco  Substance Use Topics  . Alcohol use: Yes    Alcohol/week: 0.0 standard drinks    Comment: occasional  . Drug use: No     Allergies   Patient has no known allergies.   Review of Systems Review of Systems  Ten systems reviewed and are negative for acute change, except as noted in the HPI.   Physical Exam Updated Vital Signs BP 123/76   Pulse (!) 50   Temp 98.5 F (36.9 C) (Oral)   Resp (!) 25   SpO2 96%   Physical Exam  Constitutional: He appears  well-developed and well-nourished. No distress.  HENT:  Head: Normocephalic and atraumatic.  Eyes: Conjunctivae are normal. No scleral icterus.  Neck: Normal range of motion. Neck supple.  Cardiovascular: Normal rate, regular rhythm and normal heart sounds.  Pulmonary/Chest: Effort normal and breath sounds normal. No respiratory distress.  Abdominal: Soft. There is no tenderness.  Musculoskeletal: He exhibits no edema.  Neurological: He is alert.  Skin: Skin is warm and dry. Capillary refill takes less than 2 seconds. He is not diaphoretic.  Psychiatric: His behavior is normal.  Nursing note and vitals reviewed.    ED Treatments / Results  Labs (all labs ordered are listed, but only abnormal results are displayed) Labs Reviewed  BASIC METABOLIC PANEL - Abnormal; Notable for the following components:      Result Value   Glucose, Bld 114 (*)  Calcium 8.7 (*)    All other components within normal limits  CBC  BRAIN NATRIURETIC PEPTIDE  TSH  D-DIMER, QUANTITATIVE (NOT AT [facility])  I-STAT TROPONIN, ED    EKG EKG Interpretation  Date/Time:  Tuesday July 29 2018 10:28:15 EDT Ventricular Rate:  63 PR Interval:  144 QRS Duration: 82 QT Interval:  402 QTC Calculation: 411 R Axis:   65 Text Interpretation:  Normal sinus rhythm Abnormal QRS-T angle, consider primary T wave abnormality Abnormal ECG No significant change since last tracing Confirmed by Richardean Canal [phone]) on 07/29/2018 3:36:09 PM   Radiology Dg Chest 2 View  Result Date: 07/29/2018 CLINICAL DATA:  Shortness of breath and chest pains for the past 3-4 days. Recent lower extremity venous thrombosis. Former smoker. EXAM: CHEST - 2 VIEW COMPARISON:  CT scan of the chest of May 05, 2018 and PA and lateral chest x-ray of November 24, 2014. FINDINGS: The lungs are mildly hypoinflated. The interstitial markings are slightly increased just above the right hemidiaphragm. The heart and pulmonary vascularity are normal. The  mediastinum is normal in width. The trachea is midline. There is no pleural effusion. The bony thorax exhibits no acute abnormality. IMPRESSION: Mild hypoinflation.  No definite acute cardiopulmonary abnormality. Electronically Signed   By: David  [place] M.D.   On: 07/29/2018 10:54    Procedures Procedures (including critical care time)  Medications Ordered in ED Medications - No data to display   Initial Impression / Assessment and Plan / ED Course  I have reviewed the triage vital signs and the nursing notes.  Pertinent labs & imaging results that were available during my care of the patient were reviewed by me and considered in my medical decision making (see chart for details).  Clinical Course as of Jul 29 1601  Tue Jul 29, 2018  1352 Troponin i, poc: 0.01 [AH]    Clinical Course User Index [AH] Arthor Captain, [name]    Patient with nearly 3 months of exertional dyspnea and waking up with shortness of breath.  History is gathered from the patient and review of outside records on EMR.  I personally reviewed the PA and lateral chest film which shows no acute abnormalities but some mild hypoinflation.  Agree with the radiologist's interpretation.  I reviewed the patient's EKG which shows no signs of ischemia or arrhythmias.  The emergent differential diagnosis for shortness of breath includes, but is not limited to, Pulmonary edema, bronchoconstriction, Pneumonia, Pulmonary embolism, Pneumotherax/ Hemothorax, Dysrythmia, ACS.  The patient's d-dimer is negative given the sensitivity of that test I doubt that he has any pulmonary embolus as the cause of his shortness of breath.  Spoke with Trish from cardiology who states that they will consult on the patient has I have concern for anginal symptoms and he has not had any kind of cardiac work-up.  I have given sign out to PA Joy who will assume care of the patient.  Final Clinical Impressions(s) / ED Diagnoses   Final diagnoses:    Shortness of breath    ED Discharge Orders    None       Arthor Captain, [name] 07/29/18 1602    Arthor Captain, [name] 07/29/18 1603    Azalia Bilis, MD 08/05/18 [phone]

## 2018-07-29 NOTE — ED Notes (Signed)
Room air sats 97% while ambulating

## 2018-07-29 NOTE — ED Triage Notes (Signed)
Pt reports for the last month he has been experiencing sob and difficulty taking deep breaths or doing physical labor. Pt has history of factor V and has had dvts in the past. Pt is currently taking eliqus. Pt states he has always been suffering with anxiety and went to speak to a psychiatrist. Pt also had a negative CT scan last month but sob is no better.

## 2018-07-29 NOTE — Consult Note (Addendum)
Cardiology Consultation    Patient ID: Derek CritchleyRory B [name] MRN: [id number], DOB: [birth year] Date of Encounter: 07/29/2018, 5:27 PM Primary Physician: Jarome MatinPaterson, Daniel, MD Primary Cardiologist: No primary care provider on file. -> new to Dr. Tenny Crawoss  Chief Complaint: palpitations/shortness of breath Reason for Admission: same Requesting MD: Arthor CaptainAbigail Harris, [name]  HPI: Derek [name] is a 57 y.o. male with history of factor V Leiden, DVT 7 years ago with recurrence 4 months ago, kidney stone, panic attacks/anxiety, former tobacco abuse whom we are asked to see for palpitations and SOB. He has no prior cardiac hx. He reports he was dx with DVT about 7 years ago and was treated with Coumadin for 6 months. 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 About 4 months ago he saw his PCP and had a recurrent DVT. He's been on Eliquis since that time. He is faithful about taking the AM does but admits he occasionally misses the PM dose. He smoked x 20 years ago and quit 4 years back. No fam hx of CAD, no other significant risk factors known. For the past few months he's felt more fatigued throughout the day than he ever has. He does snore. No prior OSA eval. For the last several weeks he has noticed abrupt onset of tachypalpitations (heart racing) accompanied by sensation of hyperventilation and anxiety. He also sometimes feels a sensation like he is breathing in cold air. CT angio by PCP 05/05/18 for these symptoms was negative for acute pathology. This can last anywhere from 30 mins to all day long. It improves with Xanax. When he feels his heart racing he also notices he is more SOB than usual. When the palpitations are not present he is able to perform very physical active work without dyspnea. No chest pain, edema, syncope, bleeding. No excess caffeine, excess ETOH, or h/o drug use. Has been under increased stress. No other provocative  factors.  Due to symptoms lasting from 5am->9am this morning, he came to the ER. He still felt some of the palpitations on arrival but EKG showed NSR HR 63. W/u in ER unrevealing with normal BNP, troponin, TSH, CBC, d-dimer. Mildly elevated glucose noted of 114. EKG unremarkable.  Past Medical History:  Diagnosis Date  . Factor V Leiden (HCC)   . History of DVT (deep vein thrombosis)   . History of panic attacks   . Left nephrolithiasis   . Right ureteral stone      Surgical History:  Past Surgical History:  Procedure Laterality Date  . APPENDECTOMY    . CYSTO/  RIGHT RETROGRADE PYELOGRAM/  RIGHT URETERAL STENT PLACEMENT  04-23-2014  . CYSTOSCOPY W/ URETERAL STENT PLACEMENT Right 04/23/2014   Procedure: CYSTOSCOPY  RIGHT RETROGRADE PYELOGRAM/RIGHT URETERAL STENT PLACEMENT;  Surgeon: Magdalene Mollyaniel Y Woodruff, MD;  [facility] [place];  Service: Urology;  Laterality: Right;  . CYSTOSCOPY WITH RETROGRADE PYELOGRAM, URETEROSCOPY AND STENT PLACEMENT Right 05/03/2014   Procedure: CYSTOSCOPY WITH RETROGRADE PYELOGRAM, URETEROSCOPY AND STENT EXCHANGE;  Surgeon: Magdalene Mollyaniel Y Woodruff, MD;  [facility] [place];  Service: Urology;  Laterality: Right;  . HOLMIUM LASER APPLICATION Right 05/03/2014   Procedure: HOLMIUM LASER APPLICATION;  Surgeon: Magdalene Mollyaniel Y Woodruff, MD;  [facility] [place];  Service: Urology;  Laterality: Right;     Home Meds: Prior to Admission medications   Medication Sig Start Date End Date Taking? Authorizing Provider  ALPRAZolam (  XANAX) 0.5 MG tablet Take 0.5 mg by mouth 3 (three) times daily as needed.  12/08/13  Yes [provider]  ELIQUIS 5 MG TABS tablet Take 5 mg by mouth 2 (two) times daily. 06/24/18  Yes [provider]  sertraline (ZOLOFT) 50 MG tablet Take 50 mg by mouth daily.  12/08/13  Yes [provider]  cephALEXin (KEFLEX) 500 MG capsule Take 1 capsule (500 mg total) by mouth 3 (three) times daily. Begin  the day before you removal your stent. Patient not taking: Reported on 11/24/2014 05/03/14   Natalia Leatherwood, MD  HYDROcodone-acetaminophen [facility]) 5-325 MG per tablet Take 1-2 tablets by mouth every 4 (four) hours as needed. Patient not taking: Reported on 07/29/2018 11/24/14   Carmelina Dane, MD  hyoscyamine (LEVSIN, ANASPAZ) 0.125 MG tablet Take 1 tablet (0.125 mg total) by mouth every 4 (four) hours as needed (bladder spasms). Patient not taking: Reported on 11/24/2014 04/23/14   Natalia Leatherwood, MD  ibuprofen (ADVIL,MOTRIN) 800 MG tablet Take 1 tablet (800 mg total) by mouth 3 (three) times daily. Patient not taking: Reported on 11/24/2014 04/22/14   Glynn Octave, MD  ondansetron (ZOFRAN) 4 MG tablet Take 1 tablet (4 mg total) by mouth every 6 (six) hours. Patient not taking: Reported on 11/24/2014 04/22/14   Glynn Octave, MD  oxyCODONE-acetaminophen (PERCOCET/ROXICET) 5-325 MG per tablet Take 1-2 tablets by mouth every 4 (four) hours as needed for severe pain. Patient not taking: Reported on 11/24/2014 05/03/14   Natalia Leatherwood, MD  phenazopyridine (PYRIDIUM) 200 MG tablet Take 1 tablet (200 mg total) by mouth 3 (three) times daily as needed for pain. Patient not taking: Reported on 11/24/2014 04/23/14   Natalia Leatherwood, MD  senna-docusate [facility]) 8.6-50 MG per tablet Take 1 tablet by mouth 2 (two) times daily. Patient not taking: Reported on 11/24/2014 04/23/14   Natalia Leatherwood, MD  tamsulosin (FLOMAX) 0.4 MG CAPS capsule Take 1 capsule (0.4 mg total) by mouth daily. Patient not taking: Reported on 11/24/2014 04/23/14   Natalia Leatherwood, MD    Allergies: No Known Allergies  Social History   Socioeconomic History  . Marital status: Single    Spouse name: Not on file  . Number of children: Not on file  . Years of education: Not on file  . Highest education level: Not on file  Occupational History  . Not on file  Social Needs  . Financial resource strain: Not on file   . Food insecurity:    Worry: Not on file    Inability: Not on file  . Transportation needs:    Medical: Not on file    Non-medical: Not on file  Tobacco Use  . Smoking status: Former Smoker    Packs/day: 0.50    Years: 20.00    Pack years: 10.00    Types: Cigarettes    Last attempt to quit: 04/23/2009    Years since quitting: 9.2  . Smokeless tobacco: Current User    Types: Snuff, Chew  . Tobacco comment: occasionally dips / chewtobacco. Quit ~2015.  Substance and Sexual Activity  . Alcohol use: Yes    Alcohol/week: 0.0 standard drinks    Comment: occasional - 1-2 beers/week  . Drug use: No  . Sexual activity: Not on file  Lifestyle  . Physical activity:    Days per week: Not on file    Minutes per session: Not on file  . Stress: Not on file  Relationships  . Social connections:  Talks on phone: Not on file    Gets together: Not on file    Attends religious service: Not on file    Active member of club or organization: Not on file    Attends meetings of clubs or organizations: Not on file    Relationship status: Not on file  . Intimate partner violence:    Fear of current or ex partner: Not on file    Emotionally abused: Not on file    Physically abused: Not on file    Forced sexual activity: Not on file  Other Topics Concern  . Not on file  Social History Narrative  . Not on file     Family History  Problem Relation Age of Onset  . Heart disease Paternal Grandmother        ? possible h/o heart attack    Review of Systems: No weight changes All other systems reviewed and are otherwise negative except as noted above.  Labs:   Lab Results  Component Value Date   WBC 8.7 07/29/2018   HGB 15.3 07/29/2018   HCT 46.6 07/29/2018   MCV 90.5 07/29/2018   PLT 279 07/29/2018    Recent Labs  Lab 07/29/18 1039  NA 138  K 4.4  CL 105  CO2 25  BUN 16  CREATININE 1.14  CALCIUM 8.7*  GLUCOSE 114*   No results for input(s): CKTOTAL, CKMB, TROPONINI in  the last 72 hours. No results found for: CHOL, HDL, LDLCALC, TRIG Lab Results  Component Value Date   DDIMER 0.38 07/29/2018    Radiology/Studies:  Dg Chest 2 View  Result Date: 07/29/2018 CLINICAL DATA:  Shortness of breath and chest pains for the past 3-4 days. Recent lower extremity venous thrombosis. Former smoker. EXAM: CHEST - 2 VIEW COMPARISON:  CT scan of the chest of May 05, 2018 and PA and lateral chest x-ray of November 24, 2014. FINDINGS: The lungs are mildly hypoinflated. The interstitial markings are slightly increased just above the right hemidiaphragm. The heart and pulmonary vascularity are normal. The mediastinum is normal in width. The trachea is midline. There is no pleural effusion. The bony thorax exhibits no acute abnormality. IMPRESSION: Mild hypoinflation.  No definite acute cardiopulmonary abnormality. Electronically Signed   By: David  [place] M.D.   On: 07/29/2018 10:54   Wt Readings from Last 3 Encounters:  11/24/14 102.3 kg  05/03/14 99.3 kg  04/23/14 101.2 kg    EKG: NSR 63bpm TWI III, avF, otherwise no acute changes  Physical Exam: Blood pressure 132/87, pulse (!) 53, temperature 98.5 F (36.9 C), temperature source Oral, resp. rate 14, SpO2 97 %. There is no height or weight on file to calculate BMI. General: Well developed, well nourished WM, in no acute distress. Head: Normocephalic, atraumatic, sclera non-icteric, no xanthomas, nares are without discharge.  Neck: Negative for carotid bruits. JVD not elevated. Lungs: Clear bilaterally to auscultation without wheezes, rales, or rhonchi. Breathing is unlabored. Heart: RRR with S1 S2. No murmurs, rubs, or gallops appreciated. Abdomen: Soft, non-tender, non-distended with normoactive bowel sounds. No hepatomegaly. No rebound/guarding. No obvious abdominal masses. Msk:  Strength and tone appear normal for age. Extremities: No clubbing or cyanosis. No edema.  Distal pedal pulses are 2+ and equal  bilaterally. Neuro: Alert and oriented X 3. No focal deficit. No facial asymmetry. Moves all extremities spontaneously. Psych:  Responds to questions appropriately with a normal affect.    Assessment and Plan   1. Tachypalpitations associated with dyspnea -  no objective evidence of acute ischemia in the ER. Workup fairly benign. EKG with nonspecific TW changes in III, avF. His symptoms are suggestive of an arrhythmia but telemetry in ER was benign. I will review further eval with MD. Traditionally would suggest empiric beta blocker but baseline HR appears upper 40s-low 50s at times (sinus bradycardia).  2. Factor V Leiden with h/o DVTs - CTA 05/2018 for similar symptoms negative for PE. Occasionally misses PM dose of Eliquis but seems to understand importance. Consideration could be given to using Xarelto instead but will defer to primary care.  3. Panic attacks - has been followed as OP for this. No acute decompensation in ER but question relationship to #1.  4. Profound fatigue - consider eval for OSA as OP.  For questions or updates, please contact [facility] Please consult [email or web address] for contact info under Cardiology/STEMI.  Signed, Laurann Montana, [name] 07/29/2018, 5:27 PM  Patient seen and examined   Pt is a 57 yo hx of DVT, factor V Leiden deficiency (on Eliquis) who presents with palpitaitons    He is a very anxious individual Currently tele with SB     Neck:   No bruits, no JVD  Lungs are CTA   Cardiac RRR   No murmurs   No S3   Ext are without edema Labs signif for neg troponin   EKG with SB but otherwise unremarkable  I think OK for patient to be discharged  Tried to reassure him Will make sure he is set up for an echo and an event monitor    I would not prescribe any medication given baseline bradycardia Set f/u in clinic to review results.    Dietrich Pates  With baseli

## 2018-07-29 NOTE — Progress Notes (Signed)
Sent message to office scheduler to arrange 3 week event monitor ASAP and 2d echo as well as f/u. Office will call pt to arrange. Dayna Dunn [name]

## 2018-07-29 NOTE — ED Notes (Signed)
ED Provider at bedside. 

## 2018-07-29 NOTE — ED Provider Notes (Signed)
Derek [name] is a 57 y.o. male, with a history of DVT and factor V Leiden, presenting to the ED with intermittent exertional dyspnea for several months.  He endorses episodes of palpitations that arise with exertion, followed by shortness of breath and chest discomfort.  He describes the chest discomfort as follows, "It feels like the pain or discomfort you get when you step outside to really cold air and you breath in and you get that feeling deep in your lungs."  He states that symptoms typically will improve with rest, however, notes that episodes seem to be occurring more frequently and lasting longer. Denies syncope, dizziness, vomiting/diarrhea, cough, fever.    HPI from [place], [name]: "Derek [name] is a 57 y.o. male who  has a past medical history of Factor V Leiden (HCC), History of DVT (deep vein thrombosis), History of panic attacks, Left nephrolithiasis, and Right ureteral stone.  Presents the emergency department with chief complaint of this of breath.  Patient is on Eliquis and is compliant with medications daily.  He states that since early June he has had episodes of exertional dyspnea.  Patient states that some days are better than others however nearly every day he has shortness of breath which is related to exerting himself and relieved when he stops to rest.  He states that some days are completely debilitating and it is difficult for him to even get to his car get through his workday.  He is attributed this to his ongoing anxiety and followed up both with his PCP as well as his psychiatrist.  The patient also notes that he had a CT angios in early June that was negative for any blood clots and he remains compliant with his medications.  He states also that his dyspnea has remained unchanged since that previous evaluation.  The patient states that he notices his shortness of breath when he first wakes up and states that he feels terrible that he feels like he is being smothered that  he cannot get a full breath but it gets better once he gets up and showers and takes his medications.  He denies unilateral leg swelling, hemoptysis, fevers."  Past Medical History:  Diagnosis Date  . Factor V Leiden (HCC)   . History of DVT (deep vein thrombosis)   . History of panic attacks   . Left nephrolithiasis   . Right ureteral stone      Physical Exam  BP 123/76   Pulse (!) 50   Temp 98.5 F (36.9 C) (Oral)   Resp (!) 25   SpO2 96%   Physical Exam  Constitutional: He appears well-developed and well-nourished. No distress.  HENT:  Head: Normocephalic and atraumatic.  Eyes: Conjunctivae are normal.  Neck: Neck supple.  Cardiovascular: Regular rhythm, normal heart sounds and intact distal pulses. Bradycardia present.  Pulmonary/Chest: Effort normal and breath sounds normal. No respiratory distress.  Abdominal: Soft. There is no tenderness. There is no guarding.  Musculoskeletal: He exhibits no edema or tenderness.  Lymphadenopathy:    He has no cervical adenopathy.  Neurological: He is alert.  Skin: Skin is warm and dry. He is not diaphoretic.  Psychiatric: He has a normal mood and affect. His behavior is normal.  Nursing note and vitals reviewed.   ED Course/Procedures   Abnormal Labs Reviewed  BASIC METABOLIC PANEL - Abnormal; Notable for the following components:      Result Value   Glucose, Bld 114 (*)    Calcium  8.7 (*)    All other components within normal limits   Dg Chest 2 View  Result Date: 07/29/2018 CLINICAL DATA:  Shortness of breath and chest pains for the past 3-4 days. Recent lower extremity venous thrombosis. Former smoker. EXAM: CHEST - 2 VIEW COMPARISON:  CT scan of the chest of May 05, 2018 and PA and lateral chest x-ray of November 24, 2014. FINDINGS: The lungs are mildly hypoinflated. The interstitial markings are slightly increased just above the right hemidiaphragm. The heart and pulmonary vascularity are normal. The mediastinum is normal  in width. The trachea is midline. There is no pleural effusion. The bony thorax exhibits no acute abnormality. IMPRESSION: Mild hypoinflation.  No definite acute cardiopulmonary abnormality. Electronically Signed   By: David  SwazilandJordan M.D.   On: 07/29/2018 10:54    EKG Interpretation  Date/Time:  Tuesday July 29 2018 10:28:15 EDT Ventricular Rate:  63 PR Interval:  144 QRS Duration: 82 QT Interval:  402 QTC Calculation: 411 R Axis:   65 Text Interpretation:  Normal sinus rhythm Abnormal QRS-T angle, consider primary T wave abnormality Abnormal ECG No significant change since last tracing Confirmed by Richardean CanalYao, David H [phone]([id number]) on 07/29/2018 3:36:09 PM       Clinical Course as of Jul 30 2355  Tue Jul 29, 2018  1352 Troponin i, poc: 0.01 [AH]  1603 Patient states this rate is typical for him and he has noted it as low as 40 bpm.  Pulse Rate(!): 50 [SJ]  1816 Spoke with Dr. Tenny Crawoss, Cardiologist.  States patient may be discharged from the ED. He will follow-up in the office tomorrow for an event monitor.   [SJ]    Clinical Course User Index [AH] Arthor CaptainHarris, Abigail, [name] [SJ] Alfhild Partch C, [name]    Procedures  MDM   Patient presents with palpitations and some exertional shortness of breath.  He had no such episodes during his time in the ED.  Patient was interviewed and evaluated by cardiology who advises patient will follow-up for further management in the office.  Ambulated without onset of symptoms and while maintaining SPO2 of at least 97% on room air. Return precautions discussed.  Patient voiced understanding of all instructions and is comfortable discharge.    Vitals:   07/29/18 1230 07/29/18 1245 07/29/18 1345 07/29/18 1500  BP: 110/79 111/78 126/76 123/76  Pulse: (!) 49 (!) 52 (!) 54 (!) 50  Resp: 14 19 20  (!) 25  Temp:      TempSrc:      SpO2: 94% 98% 97% 96%       [name], [name], [name] 07/29/18 2358    Charlynne PanderYao, David Hsienta, MD 07/30/18 1455

## 2018-08-05 ENCOUNTER — Ambulatory Visit (HOSPITAL_COMMUNITY): Payer: BLUE CROSS/BLUE SHIELD | Attending: Cardiovascular Disease

## 2018-08-05 ENCOUNTER — Other Ambulatory Visit: Payer: Self-pay

## 2018-08-05 ENCOUNTER — Ambulatory Visit (INDEPENDENT_AMBULATORY_CARE_PROVIDER_SITE_OTHER): Payer: BLUE CROSS/BLUE SHIELD

## 2018-08-05 DIAGNOSIS — R0602 Shortness of breath: Secondary | ICD-10-CM | POA: Diagnosis not present

## 2018-08-05 DIAGNOSIS — R079 Chest pain, unspecified: Secondary | ICD-10-CM | POA: Insufficient documentation

## 2018-08-05 DIAGNOSIS — R002 Palpitations: Secondary | ICD-10-CM

## 2018-09-01 ENCOUNTER — Telehealth: Payer: Self-pay | Admitting: Physician Assistant

## 2018-09-01 NOTE — Telephone Encounter (Signed)
LVM for return call. 

## 2018-09-01 NOTE — Telephone Encounter (Signed)
Pt called into the office today with c/o increased SOB and intermittent CP. Pt states he has seen his PCP and psychologist. He has been anxious latelty and thought this was the source of his symptoms. His cardiac workup during his last ED visit was unremarkable. However, he has had no relief in his symptoms. Pt believes it could be something cardiac related. He would like to be seen sooner than his appt with Ronie Spies on 10/10. Pt has taken an appt with Tereso Newcomer tomorrow 10/10. Pt would like to discuss additional testing to help rule out anything cardiac related.

## 2018-09-01 NOTE — Telephone Encounter (Signed)
New Message    Pt c/o Shortness Of Breath: STAT if SOB developed within the last 24 hours or pt is noticeably SOB on the phone  1. Are you currently SOB (can you hear that pt is SOB on the phone)? No  2. How long have you been experiencing SOB? All summer  3. Are you SOB when sitting or when up moving around? both  4. Are you currently experiencing any other symptoms? Weakness in legs when trying to move around

## 2018-09-02 ENCOUNTER — Ambulatory Visit (INDEPENDENT_AMBULATORY_CARE_PROVIDER_SITE_OTHER): Payer: BLUE CROSS/BLUE SHIELD | Admitting: Physician Assistant

## 2018-09-02 ENCOUNTER — Encounter (INDEPENDENT_AMBULATORY_CARE_PROVIDER_SITE_OTHER): Payer: Self-pay

## 2018-09-02 ENCOUNTER — Encounter: Payer: Self-pay | Admitting: Physician Assistant

## 2018-09-02 ENCOUNTER — Encounter: Payer: Self-pay | Admitting: *Deleted

## 2018-09-02 VITALS — BP 122/86 | HR 61 | Ht 72.0 in | Wt 240.0 lb

## 2018-09-02 DIAGNOSIS — D6851 Activated protein C resistance: Secondary | ICD-10-CM | POA: Diagnosis not present

## 2018-09-02 DIAGNOSIS — R002 Palpitations: Secondary | ICD-10-CM

## 2018-09-02 DIAGNOSIS — R072 Precordial pain: Secondary | ICD-10-CM

## 2018-09-02 DIAGNOSIS — F419 Anxiety disorder, unspecified: Secondary | ICD-10-CM

## 2018-09-02 NOTE — Patient Instructions (Addendum)
Medication Instructions:   Your physician recommends that you continue on your current medications as directed. Please refer to the Current Medication list given to you today.   If you need a refill on your cardiac medications before your next appointment, please call your pharmacy.   Labwork: NONE ORDERED  TODAY      Testing/Procedures: THIS WEEK .Your physician has requested that you have en exercise stress myoview. For further information please visit [email or web address] Please follow instruction sheet, as given.   Follow-Up:  AS SCHEDULED     Any Other Special Instructions Will Be Listed Below (If Applicable).

## 2018-09-02 NOTE — Progress Notes (Signed)
Cardiology Office Note:    Date:  09/02/2018   ID:  Derek [name], DOB 01-16-1961, MRN [id number]  PCP:  Jarome Matin, MD  Cardiologist:  Dietrich Pates, MD   Electrophysiologist:  None   Referring MD: Jarome Matin, MD   Chief Complaint  Patient presents with  . Shortness of Breath     History of Present Illness:    Derek [name] is a 56 y.o. male with factor V Leiden mutation with a history of recurrent DVT, anticoagulation with Apixaban, tobacco abuse, anxiety.  Derek [name] was evaluated by Dr. Tenny Craw in the emergency room on 08/01/2018 for fatigue, shortness of breath and rapid palpitations.  Cardiac markers, BNP, d-dimer were all negative.  Derek [name] has baseline bradycardia and AV nodal blocking agents were not prescribed.  Derek [name] was set up for follow-up echocardiogram and an event monitor.  Event monitor results are pending.  The echocardiogram demonstrated normal LV systolic and diastolic function.  Derek [name] called in yesterday with worsening symptoms of shortness of breath.  Derek [name] returns for evaluation of shortness of breath, chest discomfort and palpitations.  Derek [name] is here alone.  Derek [name] has had symptoms for the past 4 months.  Derek [name] had a chest CTA in June that demonstrated no pulmonary embolism.  Derek [name] has had to miss work several times over the past several months.  Derek [name] notes symptoms with activity.  Derek [name] mainly has shortness of breath and rapid palpitations with activity.  Derek [name] denies exertional chest pain.  Derek [name] does feel sore in Derek [name] chest at times.  Derek [name] has had bilateral shoulder pain.  Derek [name] does see a psychiatrist for anxiety.  Derek [name] has had some relief in Derek [name] symptoms when Derek [name] takes as needed medications.  Derek [name] does feel near syncopal when Derek [name] gets out of breath.  Derek [name] describes symptoms of hyperventilation.  Derek [name] denies orthopnea, PND or edema.  Prior CV studies:   The following studies were reviewed today:  Echo 08/05/18 EF 55-60, no RWMA, normal diastolic function  Chest CTA  4/0/98 FINDINGS: Cardiovascular:  --Pulmonary arteries: Contrast injection is sufficient to demonstrate satisfactory opacification of the pulmonary arteries to the segmental level. There is no pulmonary embolus. The main pulmonary artery is within normal limits for size.  --Aorta: Satisfactory opacification of the thoracic aorta. No aortic dissection or other acute aortic syndrome. Conventional 3 vessel aortic branching pattern. The aortic course and caliber are normal. There is no aortic atherosclerosis.  --Heart: Normal size. No pericardial effusion.  Mediastinum/Nodes: No mediastinal, hilar or axillary lymphadenopathy. The visualized thyroid and thoracic esophageal course are unremarkable.  Lungs/Pleura: No pulmonary nodules or masses. No pleural effusion or pneumothorax. No focal airspace consolidation. No focal pleural abnormality. Biapical air-trapping.  Upper Abdomen: Contrast bolus timing is not optimized for evaluation of the abdominal organs. Within this limitation, the visualized organs of the upper abdomen are normal.  Musculoskeletal: No chest wall abnormality. No acute or significant osseous findings.  Review of the MIP images confirms the above findings.  IMPRESSION: No pulmonary embolus, acute aortic syndrome or other acute abnormality of the chest.   Past Medical History:  Diagnosis Date  . Factor V Leiden (HCC)   . History of DVT (deep vein thrombosis)   . History of panic attacks   . Left nephrolithiasis   . Right ureteral stone    Surgical Hx: The patient  has a past surgical history that includes Appendectomy; CYSTO/  RIGHT RETROGRADE PYELOGRAM/  RIGHT URETERAL STENT PLACEMENT (04-23-2014); Cystoscopy with retrograde pyelogram, ureteroscopy  and stent placement (Right, 05/03/2014); Holmium laser application (Right, 05/03/2014); and Cystoscopy w/ ureteral stent placement (Right, 04/23/2014).   Current Medications: Current Meds  Medication Sig   . ALPRAZolam (XANAX) 0.5 MG tablet Take 0.5 mg by mouth 3 (three) times daily as needed.   Marland Kitchen ELIQUIS 5 MG TABS tablet Take 5 mg by mouth 2 (two) times daily.  . sertraline (ZOLOFT) 50 MG tablet Take 50 mg by mouth daily.      Allergies:   Patient has no known allergies.   Social History   Tobacco Use  . Smoking status: Former Smoker    Packs/day: 0.50    Years: 20.00    Pack years: 10.00    Types: Cigarettes    Last attempt to quit: 04/23/2009    Years since quitting: 9.3  . Smokeless tobacco: Current User    Types: Snuff, Chew  . Tobacco comment: occasionally dips / chewtobacco. Quit ~2015.  Substance Use Topics  . Alcohol use: Yes    Alcohol/week: 0.0 standard drinks    Comment: occasional - 1-2 beers/week  . Drug use: No     Family Hx: The patient's family history includes AAA (abdominal aortic aneurysm) in Derek [name] father; Heart attack (age of onset: 20) in Derek [name] father; Heart disease in Derek [name] paternal grandmother.  ROS:   Please see the history of present illness.    Review of Systems  Constitution: Positive for malaise/fatigue.  Cardiovascular: Positive for chest pain and dyspnea on exertion.  Respiratory: Positive for shortness of breath.    All other systems reviewed and are negative.   EKGs/Labs/Other Test Reviewed:    EKG:  EKG is  ordered today.  The ekg ordered today demonstrates normal sinus rhythm, heart rate 61, normal axis, no acute ST-T wave changes, QTC 410 ms  Recent Labs: 07/29/2018: B Natriuretic Peptide 16.3; BUN 16; Creatinine, Ser 1.14; Hemoglobin 15.3; Platelets 279; Potassium 4.4; Sodium 138; TSH 1.851   Recent Lipid Panel No results found for: CHOL, TRIG, HDL, CHOLHDL, LDLCALC, LDLDIRECT  Physical Exam:    VS:  BP 122/86   Pulse 61   Ht 6' (1.829 m)   Wt 240 lb (108.9 kg)   SpO2 97%   BMI 32.55 kg/m     Wt Readings from Last 3 Encounters:  09/02/18 240 lb (108.9 kg)  11/24/14 225 lb 9.6 oz (102.3 kg)  05/03/14 219 lb (99.3 kg)      Physical Exam  Constitutional: Derek [name] is oriented to person, place, and time. Derek [name] appears well-developed and well-nourished. No distress.  HENT:  Head: Normocephalic and atraumatic.  Eyes: No scleral icterus.  Neck: No JVD present. No thyromegaly present.  Cardiovascular: Normal rate and regular rhythm.  No murmur heard. Pulmonary/Chest: Effort normal. Derek [name] has no wheezes. Derek [name] has no rales.  Abdominal: Soft. Derek [name] exhibits no distension.  Musculoskeletal: Derek [name] exhibits no edema.  Lymphadenopathy:    Derek [name] has no cervical adenopathy.  Neurological: Derek [name] is alert and oriented to person, place, and time.  Skin: Skin is warm and dry.  Psychiatric: Derek [name] has a normal mood and affect.    ASSESSMENT & PLAN:    Precordial pain  Derek [name] presents today with symptoms of chest discomfort, exertional shortness of breath and palpitations.  Derek [name] symptoms have been ongoing for several months.  Derek [name] seems to describe more symptoms when Derek [name] tries to go to work.  But Derek [name] has symptoms outside of work as well.  Derek [name] has worn an event monitor.  Final results are  pending.  Strips that I have available to me today demonstrate normal sinus rhythm and sinus tachycardia.  ECG today is normal.  Recent echocardiogram is also normal.  Chest CT in June 2019 did not show any evidence of coronary calcification or aortic atherosclerosis.  Derek [name] has been compliant with Derek [name] Apixaban.  Recent d-dimer was negative.  Derek [name] symptoms are likely related to anxiety more so than ischemic heart disease.  We discussed the rationale for proceeding with cardiac catheterization versus coronary CTA versus stress testing.  Given Derek [name] negative prior testing, I believe that a stress test is adequate to rule out ischemic heart disease at this point.  -Arrange exercise Myoview  -Keep follow-up with Ronie Spies, [name] 09/11/18  Palpitations Event monitor results are pending.  Strips that were available to me today demonstrated sinus rhythm and sinus tachycardia.  Keep follow-up  later this month to discuss final report.  Factor V Leiden mutation [facility]) Derek [name] remains on chronic anticoagulation with Apixaban.  As noted, Derek [name] has been adherent with Apixaban since last seen in the emergency room in August 2019.  Anxiety   Derek [name] has follow-up with [facility] tomorrow.  I have asked him to keep Derek [name] appointment to discuss further management of Derek [name] anxiety   Dispo:  Return in about 9 days (around 09/11/2018) for Scheduled Follow Up w/ Ronie Spies, [name].   Medication Adjustments/Labs and Tests Ordered: Current medicines are reviewed at length with the patient today.  Concerns regarding medicines are outlined above.  Tests Ordered: Orders Placed This Encounter  Procedures  . MYOCARDIAL PERFUSION IMAGING  . EKG 12-Lead   Medication Changes: No orders of the defined types were placed in this encounter.   Signed, Tereso Newcomer, [name]  09/02/2018 3:08 PM    [facility] [address] [place], [place], [place]  [address] Phone: [phone]; Fax: [phone]

## 2018-09-03 ENCOUNTER — Ambulatory Visit (INDEPENDENT_AMBULATORY_CARE_PROVIDER_SITE_OTHER): Payer: BLUE CROSS/BLUE SHIELD | Admitting: Psychiatry

## 2018-09-03 ENCOUNTER — Encounter: Payer: Self-pay | Admitting: Psychiatry

## 2018-09-03 VITALS — BP 134/98 | HR 67 | Ht 72.0 in

## 2018-09-03 DIAGNOSIS — F410 Panic disorder [episodic paroxysmal anxiety] without agoraphobia: Secondary | ICD-10-CM | POA: Diagnosis not present

## 2018-09-03 DIAGNOSIS — Z0289 Encounter for other administrative examinations: Secondary | ICD-10-CM

## 2018-09-03 MED ORDER — ALPRAZOLAM 1 MG PO TABS: ORAL_TABLET | ORAL | 0 refills | Status: DC

## 2018-09-03 MED ORDER — VILAZODONE HCL 10 & 20 MG PO KIT: PACK | ORAL | 0 refills | Status: DC

## 2018-09-03 NOTE — Progress Notes (Signed)
Derek [name] [id number] 08-Jun-1961 57 y.o.  Assessment: Plan:    Panic disorder - Plan: Vilazodone HCl (VIIBRYD STARTER PACK) 10 & 20 MG KIT, ALPRAZolam (XANAX) 1 MG tablet   Pt seen for 30 minutes and greater than 50% of session spent counseling pt re: potential benefits, risks, and side effects of Viibryd and counseling pt to take with food to minimize risk of GI side effects. Discussed starting with low dose and titrating slowly to improve tolerability. Discussed continuing Sertraline 50 mg po qd at this time since both Sertraline and Viibryd are low dose and to prevent possible worsening anxiety with d/c of Sertraline. Discussed taking Xanax more frequently over the next 1-2 weeks to stabilize acute anxiety s/s and then resume 1 mg 1/2-1 tab po TID prn anxiety. Discussed issue of missed work due to panic s/s and intermittent FMLA forms completed during visit.   Please see After Visit Summary for patient specific instructions.  Future Appointments  Date Time Provider [place]  09/11/2018  9:30 AM Dunn, Nedra Hai, [name] [facility]    No orders of the defined types were placed in this encounter.      Subjective:   Patient ID:  Derek [name] is a 57 y.o. (DOB 1961/03/17) male.  Chief Complaint:  Chief Complaint  Patient presents with  . Panic Attack    HPI Derek [name] presents to the office today with a compliant of anxiety. Reports that Derek [name] had "a really rough couple of weeks and ended up at the ER." Reports that Derek [name] could not breathe and thought Derek [name] was having an MI. Reports that cardiac w/u in the ER was negative and Derek [name] was referred to a cardiologist. Reports that cardiac w/u with cardiologist was negative. Reports that Derek [name] had a Holter monitor and that Derek [name] was told only abnormality was that his heart rate would periodically increase.  Reports that pharmacy would not fill one of the Xanax scripts until a certain date and that Derek [name] then had to take Xanax less  frequently. Reports that Derek [name] has not worked since Thursday 08/28/18. Reports that Derek [name] attempted to go into work on Friday, 9/27 and Monday, 09/01/18. Was not able to go into work yesterday. Reports that on both days Derek [name] was unable to stay longer at work due to severe anxiety and panic attacks .Reports that Derek [name] also felt "disconnected." "I had to get people to walk me to my car." Reports awakening every morning with severe anxiety and hyperventilating. Reports panic attacks "start as soon as I go into work" and will start to hyperventilate. Had cardiologist appointment yesterday and had some anxiety at that time. Reports that Derek [name] has been having panic s/s yesterday and today. Denies any worry or anxious thoughts. Denies any acute stressors.  Denies depressed or irritable mood. Reports adequate sleep with hydroxyzine. Reports adequate appetite and recent weight gain. "I have no energy at all..I have to literally force myself to get up and do things." Concentration is adequate when not experiencing panic s/s. Reports severely impaired concentration with panic attacks. Reports that Derek [name] has not been engaging in usual hobbies or interests "because I am too tired to do it." Denies SI.   Has been taking Xanax 0.5 mg upon awakening, then 0.5 mg when Derek [name] goes to work, and 0.5 mg around lunch time. Reports hydroxyzine has caused drowsiness and cannot take it frequently. Reports that Derek [name] noticed worsening anxiety s/s around the time Derek [name] started Eliquis and stopped it and then  re-started it when anxiety did not improved. Reports that Derek [name] had adverse effects when Derek [name] tried to increase Sertraline.   Medications: I have reviewed the patient's current medications.  Allergies: No Known Allergies  Past Medical History:  Diagnosis Date  . Factor V Leiden ([place])   . History of DVT (deep vein thrombosis)   . History of panic attacks   . Left nephrolithiasis   . Right ureteral stone     Past Surgical History:  Procedure Laterality  Date  . APPENDECTOMY    . CYSTO/  RIGHT RETROGRADE PYELOGRAM/  RIGHT URETERAL STENT PLACEMENT  04-23-2014  . CYSTOSCOPY W/ URETERAL STENT PLACEMENT Right 04/23/2014   Procedure: CYSTOSCOPY  RIGHT RETROGRADE PYELOGRAM/RIGHT URETERAL STENT PLACEMENT;  Surgeon: Sharyn Creamer, MD;  Location: [facility];  Service: Urology;  Laterality: Right;  . CYSTOSCOPY WITH RETROGRADE PYELOGRAM, URETEROSCOPY AND STENT PLACEMENT Right 05/03/2014   Procedure: CYSTOSCOPY WITH RETROGRADE PYELOGRAM, URETEROSCOPY AND STENT EXCHANGE;  Surgeon: Sharyn Creamer, MD;  Location: [facility];  Service: Urology;  Laterality: Right;  . HOLMIUM LASER APPLICATION Right 0/06/3709   Procedure: HOLMIUM LASER APPLICATION;  Surgeon: Sharyn Creamer, MD;  Location: [facility];  Service: Urology;  Laterality: Right;    Family History  Problem Relation Age of Onset  . Heart disease Paternal Grandmother        ? possible h/o heart attack  . AAA (abdominal aortic aneurysm) Father   . Heart attack Father 3    Social History   Socioeconomic History  . Marital status: Single    Spouse name: Not on file  . Number of children: Not on file  . Years of education: Not on file  . Highest education level: Not on file  Occupational History  . Not on file  Social Needs  . Financial resource strain: Not on file  . Food insecurity:    Worry: Not on file    Inability: Not on file  . Transportation needs:    Medical: Not on file    Non-medical: Not on file  Tobacco Use  . Smoking status: Former Smoker    Packs/day: 0.50    Years: 20.00    Pack years: 10.00    Types: Cigarettes    Last attempt to quit: 04/23/2009    Years since quitting: 9.3  . Smokeless tobacco: Current User    Types: Snuff, Chew  . Tobacco comment: occasionally dips / chewtobacco. Quit ~2015.  Substance and Sexual Activity  . Alcohol use: Yes    Alcohol/week: 0.0 standard drinks    Comment: occasional - 1-2  beers/week  . Drug use: No  . Sexual activity: Not on file  Lifestyle  . Physical activity:    Days per week: Not on file    Minutes per session: Not on file  . Stress: Not on file  Relationships  . Social connections:    Talks on phone: Not on file    Gets together: Not on file    Attends religious service: Not on file    Active member of club or organization: Not on file    Attends meetings of clubs or organizations: Not on file    Relationship status: Not on file  . Intimate partner violence:    Fear of current or ex partner: Not on file    Emotionally abused: Not on file    Physically abused: Not on file    Forced sexual activity: Not on file  Other Topics Concern  . Not on file  Social History Narrative  . Not on file    Past Medical History, Surgical history, Social history, and Family history were reviewed and updated as appropriate.   Please see review of systems for further details on the patient's review from today.   Review of Systems:  Review of Systems  Constitutional: Negative for activity change, appetite change, fatigue and unexpected weight change.  Respiratory: Positive for shortness of breath.   Cardiovascular: Positive for palpitations.  Musculoskeletal: Negative for gait problem.  Neurological: Negative for tremors.  Psychiatric/Behavioral: Negative for agitation, behavioral problems, confusion, decreased concentration, dysphoric mood, hallucinations, sleep disturbance and suicidal ideas. The patient is not nervous/anxious and is not hyperactive.     Objective:   Physical Exam:  BP (!) 134/98   Pulse 67   Ht 6' (1.829 m)   BMI 32.55 kg/m   Physical Exam  Constitutional: Derek [name] is oriented to person, place, and time. Derek [name] appears well-developed. No distress.  Musculoskeletal: Normal range of motion.  Neurological: Derek [name] is alert and oriented to person, place, and time. Coordination normal.  Psychiatric: His speech is normal and behavior is normal.  Judgment and thought content normal. His mood appears anxious. His affect is not angry, not blunt, not labile and not inappropriate. Cognition and memory are normal. Derek [name] does not exhibit a depressed mood.  Speech: increased rate and volume Behavior: Tense, sitting on edge of seat Thought content: perseverates on panic    Lab Review:     Component Value Date/Time   NA 138 07/29/2018 1039   K 4.4 07/29/2018 1039   CL 105 07/29/2018 1039   CO2 25 07/29/2018 1039   GLUCOSE 114 (H) 07/29/2018 1039   BUN 16 07/29/2018 1039   CREATININE 1.14 07/29/2018 1039   CALCIUM 8.7 (L) 07/29/2018 1039   GFRNONAA >60 07/29/2018 1039   GFRAA >60 07/29/2018 1039       Component Value Date/Time   WBC 8.7 07/29/2018 1039   RBC 5.15 07/29/2018 1039   HGB 15.3 07/29/2018 1039   HCT 46.6 07/29/2018 1039   PLT 279 07/29/2018 1039   MCV 90.5 07/29/2018 1039   MCV 88.1 11/24/2014 1913   MCH 29.7 07/29/2018 1039   MCHC 32.8 07/29/2018 1039   RDW 12.7 07/29/2018 1039   LYMPHSABS 4.8 (H) 04/22/2014 1455   MONOABS 0.7 04/22/2014 1455   EOSABS 0.4 04/22/2014 1455   BASOSABS 0.0 04/22/2014 1455   -------------------------------

## 2018-09-11 ENCOUNTER — Ambulatory Visit: Payer: BLUE CROSS/BLUE SHIELD | Admitting: Physician Assistant

## 2018-09-18 ENCOUNTER — Encounter: Payer: Self-pay | Admitting: Emergency Medicine

## 2018-09-18 DIAGNOSIS — F410 Panic disorder [episodic paroxysmal anxiety] without agoraphobia: Secondary | ICD-10-CM | POA: Insufficient documentation

## 2018-09-18 DIAGNOSIS — F13939 Sedative, hypnotic or anxiolytic use, unspecified with withdrawal, unspecified: Secondary | ICD-10-CM | POA: Insufficient documentation

## 2018-09-18 DIAGNOSIS — F13239 Sedative, hypnotic or anxiolytic dependence with withdrawal, unspecified: Secondary | ICD-10-CM | POA: Insufficient documentation

## 2018-09-29 ENCOUNTER — Encounter: Payer: Self-pay | Admitting: Psychiatry

## 2018-09-29 ENCOUNTER — Ambulatory Visit (INDEPENDENT_AMBULATORY_CARE_PROVIDER_SITE_OTHER): Payer: BLUE CROSS/BLUE SHIELD | Admitting: Psychiatry

## 2018-09-29 VITALS — BP 137/93 | HR 57

## 2018-09-29 DIAGNOSIS — F410 Panic disorder [episodic paroxysmal anxiety] without agoraphobia: Secondary | ICD-10-CM

## 2018-09-29 MED ORDER — ALPRAZOLAM 1 MG PO TABS: ORAL_TABLET | ORAL | 0 refills | Status: DC

## 2018-09-29 NOTE — Progress Notes (Signed)
ARVID MARENGO [id number] 10-24-1961 57 y.o.  Subjective:   Patient ID:  Derek [name] is a 57 y.o. (DOB 08/16/1961) male.  Chief Complaint:  Chief Complaint  Patient presents with  . Panic Attack    HPI TERIN CRAGLE presents to the office today for follow-up of panic. He reports that he was not able to tolerate Viibryd due to multiple adverse effects. He reports that he then re-started Zoloft and moved all his medications to night except for Xanax and had been taking all of his medications in the morning. He reports that he has been taking 1/2 tab of Xanax before going to work and then a 1/2 tab of Xanax once he arrives at work and this has been helpful for his anxiety. He reports that most days he takes another 1/2 tab of Xanax after lunch/ before leaving work. Reports occasionally taking another 1/2 tab in the afternoon if no improvement. He reports that he has felt better overall and that anxiety has been better controlled over the last week and it has been almost a week since his last panic attack. He reports that he has not missed any work in the last 2 weeks for the first time in months.   He reports that he would prefer to continue Zoloft and Xanax since these medications have been the only medications that he has been able to tolerate and has had benefit. Reports that he now has anxiety in social situations and this is not typical for him. He reports anxiety in anticipation of social events. Denies worry. Denies depressed mood. He reports adequate sleep and typically sleeps about 8 hours. Appetite has been stable. Reports energy and motivation have been ok "for the most part." Reports adequate concentration. Reports that he is enjoying some of his activities. Was unable to go to a concert he had been looking forward to and paid $100 for tickets due to severe anxiety.  Denies SI.   He reports that "all this started when they put me on Eliquis." He reports that he has an apt with a hematologist  to switch Eliquis to another blood thinner. Reports that cardiologist said cardiac monitoring showed that his heart was skipping beats.  Review of Systems:  Review of Systems  Respiratory: Negative for shortness of breath.   Cardiovascular: Positive for palpitations. Negative for chest pain.  Musculoskeletal: Negative for gait problem.  Neurological: Negative for tremors.  Psychiatric/Behavioral:       Please refer to HPI    Medications: I have reviewed the patient's current medications.  Current Outpatient Medications  Medication Sig Dispense Refill  . acetaminophen (TYLENOL) 325 MG tablet Take 650 mg by mouth every 6 (six) hours as needed.    Marland Kitchen ELIQUIS 5 MG TABS tablet Take 5 mg by mouth 2 (two) times daily.  3  . sertraline (ZOLOFT) 50 MG tablet Take 50 mg by mouth daily.     Melene Muller ON 10/01/2018] ALPRAZolam (XANAX) 1 MG tablet Take 1/2-1 tab po TID prn anxiety 30 tablet 0  . hydrOXYzine (ATARAX/VISTARIL) 25 MG tablet Take 25 mg by mouth every 4 (four) hours as needed.     No current facility-administered medications for this visit.     Medication Side Effects: None  Allergies: No Known Allergies  Past Medical History:  Diagnosis Date  . Factor V Leiden ([facility])   . History of DVT (deep vein thrombosis)   . History of panic attacks   . Left nephrolithiasis   .  Right ureteral stone     Family History  Problem Relation Age of Onset  . Heart disease Paternal Grandmother        ? possible h/o heart attack  . Anxiety disorder Mother   . AAA (abdominal aortic aneurysm) Father   . Heart attack Father 81    Social History   Socioeconomic History  . Marital status: Single    Spouse name: Not on file  . Number of children: Not on file  . Years of education: Not on file  . Highest education level: Not on file  Occupational History  . Not on file  Social Needs  . Financial resource strain: Not on file  . Food insecurity:    Worry: Not on file    Inability: Not on  file  . Transportation needs:    Medical: Not on file    Non-medical: Not on file  Tobacco Use  . Smoking status: Former Smoker    Packs/day: 0.50    Years: 20.00    Pack years: 10.00    Types: Cigarettes    Last attempt to quit: 04/23/2009    Years since quitting: 9.4  . Smokeless tobacco: Current User    Types: Snuff, Chew  . Tobacco comment: occasionally dips / chewtobacco. Quit ~2015.  Substance and Sexual Activity  . Alcohol use: Yes    Alcohol/week: 0.0 standard drinks    Comment: occasional - 1-2 beers/week  . Drug use: No  . Sexual activity: Not on file  Lifestyle  . Physical activity:    Days per week: Not on file    Minutes per session: Not on file  . Stress: Not on file  Relationships  . Social connections:    Talks on phone: Not on file    Gets together: Not on file    Attends religious service: Not on file    Active member of club or organization: Not on file    Attends meetings of clubs or organizations: Not on file    Relationship status: Not on file  . Intimate partner violence:    Fear of current or ex partner: Not on file    Emotionally abused: Not on file    Physically abused: Not on file    Forced sexual activity: Not on file  Other Topics Concern  . Not on file  Social History Narrative  . Not on file    Past Medical History, Surgical history, Social history, and Family history were reviewed and updated as appropriate.   Please see review of systems for further details on the patient's review from today.   Objective:   Physical Exam:  BP (!) 137/93   Pulse (!) 57   Physical Exam  Constitutional: He is oriented to person, place, and time. He appears well-developed. No distress.  Musculoskeletal: He exhibits no deformity.  Neurological: He is alert and oriented to person, place, and time. Coordination normal.  Psychiatric: His speech is normal and behavior is normal. Judgment and thought content normal. His mood appears not anxious.  Cognition and memory are normal. He expresses no homicidal and no suicidal ideation. He expresses no suicidal plans and no homicidal plans.  Mood and affect present as somewhat anxious.  Continues to present as tenths and sitting on edge of seat. Insight intact. No auditory or visual hallucinations. No delusions.     Lab Review:     Component Value Date/Time   NA 138 07/29/2018 1039   K 4.4 07/29/2018 1039  CL 105 07/29/2018 1039   CO2 25 07/29/2018 1039   GLUCOSE 114 (H) 07/29/2018 1039   BUN 16 07/29/2018 1039   CREATININE 1.14 07/29/2018 1039   CALCIUM 8.7 (L) 07/29/2018 1039   GFRNONAA >60 07/29/2018 1039   GFRAA >60 07/29/2018 1039       Component Value Date/Time   WBC 8.7 07/29/2018 1039   RBC 5.15 07/29/2018 1039   HGB 15.3 07/29/2018 1039   HCT 46.6 07/29/2018 1039   PLT 279 07/29/2018 1039   MCV 90.5 07/29/2018 1039   MCV 88.1 11/24/2014 1913   MCH 29.7 07/29/2018 1039   MCHC 32.8 07/29/2018 1039   RDW 12.7 07/29/2018 1039   LYMPHSABS 4.8 (H) 04/22/2014 1455   MONOABS 0.7 04/22/2014 1455   EOSABS 0.4 04/22/2014 1455   BASOSABS 0.0 04/22/2014 1455    No results found for: POCLITH, LITHIUM   No results found for: PHENYTOIN, PHENOBARB, VALPROATE, CBMZ   .res Assessment: Plan:   Patient seen for 30 minutes and greater than 50% of visit spent counseling patient regarding panic signs and symptoms and plan for ongoing treatment.  Discussed continuing Xanax 1 mg 1/2-1 tab p.o. 3 times daily as needed anxiety/panic for another month to ensure that patient has an adequate supply and is not anxious about running out of Xanax.  Anticipate that panic and anxiety will likely continue to improve and frequency of Xanax as needed use will decrease.  Discussed scheduling follow-up appointment in 4 to 6 weeks in the event that panic has not fully resolved.  Discussed that he could cancel this appointment if anxiety signs and symptoms are well controlled and then reschedule several  months later.  Recommended patient call office regardless in about 1 month with update about Xanax usage to determine if Xanax can be refilled for 0.5 mg tablets.  Recommend continuing sertraline 50 mg at bedtime for anxiety. Panic disorder without agoraphobia - Plan: ALPRAZolam (XANAX) 1 MG tablet  Panic disorder  Please see After Visit Summary for patient specific instructions.  Future Appointments  Date Time Provider Department Center  11/10/2018  1:30 PM Corie Chiquito, PMHNP [facility] None    No orders of the defined types were placed in this encounter.     -------------------------------

## 2018-10-13 DIAGNOSIS — R82998 Other abnormal findings in urine: Secondary | ICD-10-CM | POA: Diagnosis not present

## 2018-10-13 DIAGNOSIS — Z125 Encounter for screening for malignant neoplasm of prostate: Secondary | ICD-10-CM | POA: Diagnosis not present

## 2018-10-13 DIAGNOSIS — Z0000 Encounter for general adult medical examination without abnormal findings: Secondary | ICD-10-CM | POA: Diagnosis not present

## 2018-10-20 DIAGNOSIS — Z1389 Encounter for screening for other disorder: Secondary | ICD-10-CM | POA: Diagnosis not present

## 2018-10-20 DIAGNOSIS — G4709 Other insomnia: Secondary | ICD-10-CM | POA: Diagnosis not present

## 2018-10-20 DIAGNOSIS — E291 Testicular hypofunction: Secondary | ICD-10-CM | POA: Diagnosis not present

## 2018-10-20 DIAGNOSIS — E875 Hyperkalemia: Secondary | ICD-10-CM | POA: Diagnosis not present

## 2018-10-20 DIAGNOSIS — Z0000 Encounter for general adult medical examination without abnormal findings: Secondary | ICD-10-CM | POA: Diagnosis not present

## 2018-10-20 DIAGNOSIS — Z7901 Long term (current) use of anticoagulants: Secondary | ICD-10-CM | POA: Diagnosis not present

## 2018-10-22 DIAGNOSIS — E291 Testicular hypofunction: Secondary | ICD-10-CM | POA: Diagnosis not present

## 2018-10-23 ENCOUNTER — Other Ambulatory Visit: Payer: Self-pay

## 2018-10-23 ENCOUNTER — Telehealth: Payer: Self-pay | Admitting: Psychiatry

## 2018-10-23 DIAGNOSIS — F410 Panic disorder [episodic paroxysmal anxiety] without agoraphobia: Secondary | ICD-10-CM

## 2018-10-23 MED ORDER — ALPRAZOLAM 1 MG PO TABS: 1.0000 mg | ORAL_TABLET | Freq: Two times a day (BID) | ORAL | 2 refills | Status: DC | PRN

## 2018-10-23 NOTE — Telephone Encounter (Signed)
Please advise 

## 2018-10-23 NOTE — Telephone Encounter (Signed)
PATIENT HAS APPT. 12/09 WOULD LIKE A REFILL ON ALPRAZOLAM(XANAX) SCRIPT RUNS OUT ON 11/30 WOULD LIKE TO CUT BACK TO 2X A DAY 2 MG SEND TO [facility] ON [address], PT CAN BE REACHED ON CELL [phone] OKAY TO LEAVE MESSAGE

## 2018-10-23 NOTE — Telephone Encounter (Signed)
Pt was on xanax 1 mg TID.  Would like decreased to Xanax 1 mg BID. Has been doing this about 1 1/2 weeks and has done well.

## 2018-10-23 NOTE — Telephone Encounter (Signed)
TCUTRLM  

## 2018-10-24 NOTE — Telephone Encounter (Signed)
Pt aware of information and says, "Thank you."

## 2018-10-29 ENCOUNTER — Telehealth: Payer: Self-pay | Admitting: Psychiatry

## 2018-10-29 DIAGNOSIS — E291 Testicular hypofunction: Secondary | ICD-10-CM | POA: Diagnosis not present

## 2018-10-29 DIAGNOSIS — F410 Panic disorder [episodic paroxysmal anxiety] without agoraphobia: Secondary | ICD-10-CM

## 2018-10-29 MED ORDER — ALPRAZOLAM 1 MG PO TABS: 1.0000 mg | ORAL_TABLET | Freq: Two times a day (BID) | ORAL | 2 refills | Status: DC | PRN

## 2018-10-29 NOTE — Telephone Encounter (Signed)
Pt notified of rx. 

## 2018-10-29 NOTE — Telephone Encounter (Signed)
[facility] [address] [facility] not [facility] in [facility].  Patient requesting [facility] [address] [facility].  Prescription sent.

## 2018-11-04 DIAGNOSIS — F419 Anxiety disorder, unspecified: Secondary | ICD-10-CM | POA: Diagnosis not present

## 2018-11-04 DIAGNOSIS — Z6832 Body mass index (BMI) 32.0-32.9, adult: Secondary | ICD-10-CM | POA: Diagnosis not present

## 2018-11-04 DIAGNOSIS — E291 Testicular hypofunction: Secondary | ICD-10-CM | POA: Diagnosis not present

## 2018-11-04 DIAGNOSIS — D682 Hereditary deficiency of other clotting factors: Secondary | ICD-10-CM | POA: Diagnosis not present

## 2018-11-10 ENCOUNTER — Ambulatory Visit (INDEPENDENT_AMBULATORY_CARE_PROVIDER_SITE_OTHER): Payer: BLUE CROSS/BLUE SHIELD | Admitting: Psychiatry

## 2018-11-10 ENCOUNTER — Encounter: Payer: Self-pay | Admitting: Psychiatry

## 2018-11-10 VITALS — BP 131/93 | HR 61

## 2018-11-10 DIAGNOSIS — F410 Panic disorder [episodic paroxysmal anxiety] without agoraphobia: Secondary | ICD-10-CM | POA: Diagnosis not present

## 2018-11-10 MED ORDER — ALPRAZOLAM 1 MG PO TABS: 1.0000 mg | ORAL_TABLET | Freq: Two times a day (BID) | ORAL | 1 refills | Status: DC | PRN

## 2018-11-10 MED ORDER — PROPRANOLOL HCL 10 MG PO TABS: ORAL_TABLET | ORAL | 5 refills | Status: DC

## 2018-11-10 NOTE — Progress Notes (Signed)
Derek [name] [id number] 28-Jul-1961 57 y.o.  Subjective:   Patient ID:  Derek [name] is a 57 y.o. (DOB 08/09/1961) male.  Chief Complaint:  Chief Complaint  Patient presents with  . Follow-up    h/o Panic attacks    HPI Derek [name] presents to the office today for follow-up of anxiety and panic. Reports that anxiety is currently manageable. Reports that he thinks there would be certain situations that would likely trigger anxiety, such as closed in spaces. Had a panic attack when buying a tool during a [facility] Friday sale. Has been exercising again and limiting outside work to reduce stress. Reports that he went to a social gathering at a friend's for Thanksgiving and this was unusual for him. Mood is stable. Sleep has been adequate up until the last few days after taking Tamiflu. Reports energy and motivation. Denies impaired concentration. Denies SI.   Reports that he is typically taking Xanax 1 mg 1/2 tab po q am, then 1/2 mid-day, and 1/2 in the afternoon.   Reports that he is taking Zoloft at night instead of the morning and this seems to work better for him.   Reports trail with PCP to come off of blood thinners to determine if this could be contributing to anxiety and there was no change, and resumed blood thinner.   Has upcoming sleep study scheduled. Reports that he had low testosterone and noticed improved energy after testosterone injection.    Review of Systems:  Review of Systems  HENT:       Recently had the flu and today was his first day back at work.   Musculoskeletal: Negative for gait problem.  Neurological: Negative for tremors.  Psychiatric/Behavioral:       Please refer to HPI    Medications: I have reviewed the patient's current medications.  Current Outpatient Medications  Medication Sig Dispense Refill  . acetaminophen (TYLENOL) 325 MG tablet Take 650 mg by mouth every 6 (six) hours as needed.    Melene Muller ON 01/21/2019] ALPRAZolam (XANAX) 1 MG tablet Take  1 tablet (1 mg total) by mouth 2 (two) times daily as needed for anxiety. 60 tablet 1  . ELIQUIS 5 MG TABS tablet Take 5 mg by mouth 2 (two) times daily.  3  . Oseltamivir Phosphate (TAMIFLU PO) Take by mouth.    . Pseudoephedrine-APAP-DM (DAYQUIL PO) Take by mouth.    . sertraline (ZOLOFT) 50 MG tablet Take 50 mg by mouth daily.     . hydrOXYzine (ATARAX/VISTARIL) 25 MG tablet Take 25 mg by mouth every 4 (four) hours as needed.    . propranolol (INDERAL) 10 MG tablet Take 1/2-1 tab po BID prn anxiety 60 tablet 5   No current facility-administered medications for this visit.     Medication Side Effects: None  Allergies: No Known Allergies  Past Medical History:  Diagnosis Date  . Factor V Leiden (HCC)   . History of DVT (deep vein thrombosis)   . History of panic attacks   . Left nephrolithiasis   . Low testosterone   . Right ureteral stone     Family History  Problem Relation Age of Onset  . Heart disease Paternal Grandmother        ? possible h/o heart attack  . Anxiety disorder Mother   . AAA (abdominal aortic aneurysm) Father   . Heart attack Father 60    Social History   Socioeconomic History  . Marital status: Single  Spouse name: Not on file  . Number of children: Not on file  . Years of education: Not on file  . Highest education level: Not on file  Occupational History  . Not on file  Social Needs  . Financial resource strain: Not on file  . Food insecurity:    Worry: Not on file    Inability: Not on file  . Transportation needs:    Medical: Not on file    Non-medical: Not on file  Tobacco Use  . Smoking status: Former Smoker    Packs/day: 0.50    Years: 20.00    Pack years: 10.00    Types: Cigarettes    Last attempt to quit: 04/23/2009    Years since quitting: 9.5  . Smokeless tobacco: Current User    Types: Snuff, Chew  . Tobacco comment: occasionally dips / chewtobacco. Quit ~2015.  Substance and Sexual Activity  . Alcohol use: Yes     Alcohol/week: 0.0 standard drinks    Comment: occasional - 1-2 beers/week  . Drug use: No  . Sexual activity: Not on file  Lifestyle  . Physical activity:    Days per week: Not on file    Minutes per session: Not on file  . Stress: Not on file  Relationships  . Social connections:    Talks on phone: Not on file    Gets together: Not on file    Attends religious service: Not on file    Active member of club or organization: Not on file    Attends meetings of clubs or organizations: Not on file    Relationship status: Not on file  . Intimate partner violence:    Fear of current or ex partner: Not on file    Emotionally abused: Not on file    Physically abused: Not on file    Forced sexual activity: Not on file  Other Topics Concern  . Not on file  Social History Narrative  . Not on file    Past Medical History, Surgical history, Social history, and Family history were reviewed and updated as appropriate.   Please see review of systems for further details on the patient's review from today.   Objective:   Physical Exam:  BP (!) 131/93   Pulse 61   Physical Exam  Constitutional: He is oriented to person, place, and time. He appears well-developed. No distress.  Musculoskeletal: He exhibits no deformity.  Neurological: He is alert and oriented to person, place, and time. Coordination normal.  Psychiatric: He has a normal mood and affect. His speech is normal and behavior is normal. Judgment and thought content normal. His mood appears not anxious. His affect is not angry, not blunt, not labile and not inappropriate. Cognition and memory are normal. He does not exhibit a depressed mood. He expresses no homicidal and no suicidal ideation. He expresses no suicidal plans and no homicidal plans.  Insight intact. No auditory or visual hallucinations. No delusions.     Lab Review:     Component Value Date/Time   NA 138 07/29/2018 1039   K 4.4 07/29/2018 1039   CL 105  07/29/2018 1039   CO2 25 07/29/2018 1039   GLUCOSE 114 (H) 07/29/2018 1039   BUN 16 07/29/2018 1039   CREATININE 1.14 07/29/2018 1039   CALCIUM 8.7 (L) 07/29/2018 1039   GFRNONAA >60 07/29/2018 1039   GFRAA >60 07/29/2018 1039       Component Value Date/Time   WBC 8.7 07/29/2018  1039   RBC 5.15 07/29/2018 1039   HGB 15.3 07/29/2018 1039   HCT 46.6 07/29/2018 1039   PLT 279 07/29/2018 1039   MCV 90.5 07/29/2018 1039   MCV 88.1 11/24/2014 1913   MCH 29.7 07/29/2018 1039   MCHC 32.8 07/29/2018 1039   RDW 12.7 07/29/2018 1039   LYMPHSABS 4.8 (H) 04/22/2014 1455   MONOABS 0.7 04/22/2014 1455   EOSABS 0.4 04/22/2014 1455   BASOSABS 0.0 04/22/2014 1455    No results found for: POCLITH, LITHIUM   No results found for: PHENYTOIN, PHENOBARB, VALPROATE, CBMZ   .res Assessment: Plan:   Patient inquires about retrial of propanolol.  Discussed potential benefits, risks, and side effects of propanolol and reviewed when and how to take it.  Recommend low-dose considering history of sensitivity to medications.  Will start propanolol 10 mg 1/2-1 tab p.o. twice daily as needed anxiety.  Will continue Xanax 1 mg 1/2 to 1 tablet twice daily as needed anxiety. Continue sertraline 50 mg daily for anxiety. Patient to follow-up in 6 months or sooner if clinically indicated.  Patient advised to contact office if he experiences any worsening signs and symptoms before scheduled follow-up appointment. Panic disorder - Chronic with some recent improvement - Plan: propranolol (INDERAL) 10 MG tablet, ALPRAZolam (XANAX) 1 MG tablet  Please see After Visit Summary for patient specific instructions.  Future Appointments  Date Time Provider Department Center  12/23/2018  4:00 PM Huston Foley, MD [facility] None  02/24/2019  4:00 PM Corie Chiquito, PMHNP [facility] None    No orders of the defined types were placed in this encounter.     -------------------------------

## 2018-11-11 DIAGNOSIS — Z23 Encounter for immunization: Secondary | ICD-10-CM | POA: Diagnosis not present

## 2018-11-19 DIAGNOSIS — E291 Testicular hypofunction: Secondary | ICD-10-CM | POA: Diagnosis not present

## 2018-12-10 DIAGNOSIS — E291 Testicular hypofunction: Secondary | ICD-10-CM | POA: Diagnosis not present

## 2018-12-23 ENCOUNTER — Institutional Professional Consult (permissible substitution): Payer: Self-pay | Admitting: Neurology

## 2018-12-31 DIAGNOSIS — E291 Testicular hypofunction: Secondary | ICD-10-CM | POA: Diagnosis not present

## 2019-01-21 DIAGNOSIS — E291 Testicular hypofunction: Secondary | ICD-10-CM | POA: Diagnosis not present

## 2019-01-26 ENCOUNTER — Other Ambulatory Visit: Payer: Self-pay | Admitting: Psychiatry

## 2019-01-26 DIAGNOSIS — F410 Panic disorder [episodic paroxysmal anxiety] without agoraphobia: Secondary | ICD-10-CM

## 2019-01-27 DIAGNOSIS — Z7901 Long term (current) use of anticoagulants: Secondary | ICD-10-CM | POA: Diagnosis not present

## 2019-01-27 DIAGNOSIS — G4709 Other insomnia: Secondary | ICD-10-CM | POA: Diagnosis not present

## 2019-01-27 DIAGNOSIS — E291 Testicular hypofunction: Secondary | ICD-10-CM | POA: Diagnosis not present

## 2019-01-27 DIAGNOSIS — F418 Other specified anxiety disorders: Secondary | ICD-10-CM | POA: Diagnosis not present

## 2019-01-28 ENCOUNTER — Other Ambulatory Visit: Payer: Self-pay | Admitting: Psychiatry

## 2019-01-28 DIAGNOSIS — F410 Panic disorder [episodic paroxysmal anxiety] without agoraphobia: Secondary | ICD-10-CM

## 2019-01-29 ENCOUNTER — Other Ambulatory Visit: Payer: Self-pay

## 2019-01-29 ENCOUNTER — Encounter: Payer: Self-pay | Admitting: Neurology

## 2019-01-29 ENCOUNTER — Ambulatory Visit (INDEPENDENT_AMBULATORY_CARE_PROVIDER_SITE_OTHER): Payer: BLUE CROSS/BLUE SHIELD | Admitting: Neurology

## 2019-01-29 VITALS — BP 139/89 | HR 63 | Ht 72.0 in | Wt 235.0 lb

## 2019-01-29 DIAGNOSIS — F410 Panic disorder [episodic paroxysmal anxiety] without agoraphobia: Secondary | ICD-10-CM

## 2019-01-29 DIAGNOSIS — I82409 Acute embolism and thrombosis of unspecified deep veins of unspecified lower extremity: Secondary | ICD-10-CM

## 2019-01-29 DIAGNOSIS — D6851 Activated protein C resistance: Secondary | ICD-10-CM | POA: Diagnosis not present

## 2019-01-29 DIAGNOSIS — E669 Obesity, unspecified: Secondary | ICD-10-CM | POA: Diagnosis not present

## 2019-01-29 DIAGNOSIS — R0683 Snoring: Secondary | ICD-10-CM

## 2019-01-29 DIAGNOSIS — Z820 Family history of epilepsy and other diseases of the nervous system: Secondary | ICD-10-CM | POA: Diagnosis not present

## 2019-01-29 NOTE — Patient Instructions (Signed)

## 2019-01-29 NOTE — Progress Notes (Signed)
Subjective:    Patient ID: Derek [name] is a 58 y.o. male.  HPI     Huston Foley, MD, PhD [facility] [address] [address] [place], [place] [address]  Dear Dr. Eloise Harman,   I saw your patient, Derek [name], upon your kind request in the sleep clinic today for initial consultation of his sleep disorder, in particular, difficulty with sleep initiation and sleep maintenance, lack of energy during the day. The patient is unaccompanied today. As you know, Derek [name] is a 58 year old right-handed gentleman with an underlying medical history of anxiety and panic attacks, thrombophlebitis, factor V Leiden, on Eliquis, prior smoking, and obesity, who reports having worsening anxiety and panic attacks. He is wondering if an underlying sleep disorder may be a contributor. He does believe he snores. He reports a family history of sleep apnea and some maternal uncles. He has a strong family history of factor V Leiden. He has seen psychiatry for his anxiety and panic disorder and has tried multiple different medications which at times cause side effects or no help. He is currently on Xanax 1 mg strength half a pill up to 4 times a day. He also takes sertraline 100 mg strength half pill daily. He has had counseling as well. I reviewed your office note from 10/20/2018, which you kindly included. He was 4/25 years, he lives alone. He has a 44 year old son who is married. He has no grandchildren. He does watch TV in his bedroom and the TV turns off on a timer at night. Bedtime is generally around 9 PM and rise time around 4:30 AM. He denies night to night nocturia morning headaches but does not wake up rested. He works for [employer] He uses smokeless tobacco. He drinks alcohol about 4 or 5 beers per week on average, caffeine in the form of soda, 2-3 per day on average. His Epworth sleepiness score is 3 out of 24 today, fatigue score is 27 out of 63. He has gained about 10 pounds  in the past 6 months.  His Past Medical History Is Significant For: Past Medical History:  Diagnosis Date  . Factor V Leiden (HCC)   . History of DVT (deep vein thrombosis)   . History of panic attacks   . Left nephrolithiasis   . Low testosterone   . Right ureteral stone     His Past Surgical History Is Significant For: Past Surgical History:  Procedure Laterality Date  . APPENDECTOMY    . CYSTO/  RIGHT RETROGRADE PYELOGRAM/  RIGHT URETERAL STENT PLACEMENT  04-23-2014  . CYSTOSCOPY W/ URETERAL STENT PLACEMENT Right 04/23/2014   Procedure: CYSTOSCOPY  RIGHT RETROGRADE PYELOGRAM/RIGHT URETERAL STENT PLACEMENT;  Surgeon: Magdalene Molly, MD;  Location: [facility];  Service: Urology;  Laterality: Right;  . CYSTOSCOPY WITH RETROGRADE PYELOGRAM, URETEROSCOPY AND STENT PLACEMENT Right 05/03/2014   Procedure: CYSTOSCOPY WITH RETROGRADE PYELOGRAM, URETEROSCOPY AND STENT EXCHANGE;  Surgeon: Magdalene Molly, MD;  Location: [facility];  Service: Urology;  Laterality: Right;  . HOLMIUM LASER APPLICATION Right 05/03/2014   Procedure: HOLMIUM LASER APPLICATION;  Surgeon: Magdalene Molly, MD;  Location: [facility];  Service: Urology;  Laterality: Right;    His Family History Is Significant For: Family History  Problem Relation Age of Onset  . Heart disease Paternal Grandmother        ? possible h/o heart attack  . Anxiety disorder Mother   . AAA (abdominal aortic  aneurysm) Father   . Heart attack Father 76    His Social History Is Significant For: Social History   Socioeconomic History  . Marital status: Single    Spouse name: Not on file  . Number of children: Not on file  . Years of education: Not on file  . Highest education level: Not on file  Occupational History  . Not on file  Social Needs  . Financial resource strain: Not on file  . Food insecurity:    Worry: Not on file    Inability: Not on file  . Transportation needs:     Medical: Not on file    Non-medical: Not on file  Tobacco Use  . Smoking status: Former Smoker    Packs/day: 0.50    Years: 20.00    Pack years: 10.00    Types: Cigarettes    Last attempt to quit: 04/23/2009    Years since quitting: 9.7  . Smokeless tobacco: Current User    Types: Snuff, Chew  . Tobacco comment: occasionally dips / chewtobacco. Quit ~2015.  Substance and Sexual Activity  . Alcohol use: Yes    Alcohol/week: 0.0 standard drinks    Comment: occasional - 1-2 beers/week  . Drug use: No  . Sexual activity: Not on file  Lifestyle  . Physical activity:    Days per week: Not on file    Minutes per session: Not on file  . Stress: Not on file  Relationships  . Social connections:    Talks on phone: Not on file    Gets together: Not on file    Attends religious service: Not on file    Active member of club or organization: Not on file    Attends meetings of clubs or organizations: Not on file    Relationship status: Not on file  Other Topics Concern  . Not on file  Social History Narrative  . Not on file    His Allergies Are:  No Known Allergies:   His Current Medications Are:  Outpatient Encounter Medications as of 01/29/2019  Medication Sig  . ALPRAZolam (XANAX) 1 MG tablet Take 1 tablet (1 mg total) by mouth 2 (two) times daily as needed for anxiety.  Marland Kitchen ELIQUIS 5 MG TABS tablet Take 5 mg by mouth 2 (two) times daily.  . Pseudoephedrine-APAP-DM (DAYQUIL PO) Take by mouth.  . sertraline (ZOLOFT) 50 MG tablet Take 50 mg by mouth daily.   . [DISCONTINUED] propranolol (INDERAL) 10 MG tablet Take 1/2-1 tab po BID prn anxiety  . [DISCONTINUED] acetaminophen (TYLENOL) 325 MG tablet Take 650 mg by mouth every 6 (six) hours as needed.  . [DISCONTINUED] hydrOXYzine (ATARAX/VISTARIL) 25 MG tablet Take 25 mg by mouth every 4 (four) hours as needed.  . [DISCONTINUED] Oseltamivir Phosphate (TAMIFLU PO) Take by mouth.   No facility-administered encounter medications on  file as of 01/29/2019.   :  Review of Systems:  Out of a complete 14 point review of systems, all are reviewed and negative with the exception of these symptoms as listed below: Review of Systems  Neurological:       Pt presents today to discuss his sleep. Pt has never had a sleep study and is unsure if he snores.  Epworth Sleepiness Scale 0= would never doze 1= slight chance of dozing 2= moderate chance of dozing 3= high chance of dozing  Sitting and reading: 1 Watching TV: 1 Sitting inactive in a public place (ex. Theater or meeting): 0 As  a passenger in a car for an hour without a break: 0 Lying down to rest in the afternoon: 1 Sitting and talking to someone: 0 Sitting quietly after lunch (no alcohol): 0 In a car, while stopped in traffic: 0 Total: 3     Objective:  Neurological Exam  Physical Exam Physical Examination:   Vitals:   01/29/19 1545  BP: 139/89  Pulse: 63   General Examination: The patient is a very pleasant 58 y.o. male in no acute distress. He appears well-developed and well-nourished and well groomed.   HEENT: Normocephalic, atraumatic, pupils are equal, round and reactive to light and accommodation. Extraocular tracking is good without limitation to gaze excursion or nystagmus noted. Normal smooth pursuit is noted. Hearing is grossly intact. Face is symmetric with normal facial animation and normal facial sensation. Speech is clear with no dysarthria noted. There is no hypophonia. There is no lip, neck/head, jaw or voice tremor. Neck is supple with full range of passive and active motion. There are no carotid bruits on auscultation. Oropharynx exam reveals: no mouth dryness, adequate dental hygiene and moderate airway crowding, due to Larger uvula, redundant soft palate, tonsils in place, about 1+ bilaterally. He has mild pharyngeal and uvula erythema. Mallampati is class II. Tongue protrudes centrally and palate elevates symmetrically. Neck size is 18.5  inches. He has a minimal overbite.    Chest: Clear to auscultation without wheezing, rhonchi or crackles noted.  Heart: S1+S2+0, regular and normal without murmurs, rubs or gallops noted.   Abdomen: Soft, non-tender and non-distended with normal bowel sounds appreciated on auscultation.  Extremities: There is no pitting edema in the distal lower extremities bilaterally. Right distal leg with slight discoloration and hyperpigmentation, also was slightly larger in caliber than left. He had DVTs or superficial thrombophlebitis in the right leg.  Skin: Warm and dry without trophic changes noted.  Musculoskeletal: exam reveals no obvious joint deformities, tenderness or joint swelling or erythema.   Neurologically:  Mental status: The patient is awake, alert and oriented in all 4 spheres. His immediate and remote memory, attention, language skills and fund of knowledge are appropriate. There is no evidence of aphasia, agnosia, apraxia or anomia. Speech is clear with normal prosody and enunciation. Thought process is linear. Mood is normal and affect is normal.  Cranial nerves II - XII are as described above under HEENT exam. In addition: shoulder shrug is normal with equal shoulder height noted. Motor exam: Normal bulk, strength and tone is noted. There is no drift, tremor or rebound. Romberg is negative. Fine motor skills and coordination: intact with normal finger taps, normal hand movements, normal rapid alternating patting, normal foot taps and normal foot agility.  Cerebellar testing: No dysmetria or intention tremor on finger to nose testing. Heel to shin is unremarkable bilaterally. There is no truncal or gait ataxia.  Sensory exam: intact to light touch in the upper and lower extremities.  Gait, station and balance: He stands easily. No veering to one side is noted. No leaning to one side is noted. Posture is age-appropriate and stance is narrow based. Gait shows normal stride length and  normal pace. No problems turning are noted. Tandem walk is slightly challenging for him. He is wearing his work boots.   Assessment and Plan:  In summary, Derek [name] is a very pleasant 58 y.o.-year old male with an underlying medical history of anxiety and panic attacks, thrombophlebitis, factor V Leiden, on Eliquis, prior smoking, and obesity, who presents for  evaluation of his sleep. His history and examination are concerning for underlying obstructive sleep apnea (OSA). I had a long chat with the patient about my findings and the diagnosis of OSA, its prognosis and treatment options. We talked about medical treatments, surgical interventions and non-pharmacological approaches. I explained in particular the risks and ramifications of untreated moderate to severe OSA, especially with respect to developing cardiovascular disease down the Road, including congestive heart failure, difficult to treat hypertension, cardiac arrhythmias, or stroke. Even type 2 diabetes has, in part, been linked to untreated OSA. Symptoms of untreated OSA include daytime sleepiness, memory problems, mood irritability and mood disorder such as depression and anxiety, lack of energy, as well as recurrent headaches, especially morning headaches. We talked about smoking cessation and trying to maintain a healthy lifestyle in general, as well as the importance of weight control. I encouraged the patient to eat healthy, exercise daily and keep well hydrated, to keep a scheduled bedtime and wake time routine, to not skip any meals and eat healthy snacks in between meals. I advised the patient not to drive when feeling sleepy. I recommended the following at this time: sleep study with potential positive airway pressure titration. (We will score hypopneas at 3%).   I explained the sleep test procedure to the patient and also outlined possible surgical and non-surgical treatment options of OSA, including the use of a custom-made dental  device (which would require a referral to a specialist dentist or oral surgeon), upper airway surgical options, such as pillar implants, radiofrequency surgery, tongue base surgery, and UPPP (which would involve a referral to an ENT surgeon). Rarely, jaw surgery such as mandibular advancement may be considered.  I also explained the CPAP treatment option to the patient, who indicated that he would be willing to try CPAP if the need arises. I explained the importance of being compliant with PAP treatment, not only for insurance purposes but primarily to improve His symptoms, and for the patient's long term health benefit, including to reduce His cardiovascular risks. I answered all his questions today and the patient was in agreement. I plan to see him back after the sleep study is completed and encouraged him to call with any interim questions, concerns, problems or updates.   Thank you very much for allowing me to participate in the care of this nice patient. If I can be of any further assistance to you please do not hesitate to call me at [phone].  Sincerely,   Huston Foley, MD, PhD

## 2019-02-11 DIAGNOSIS — E291 Testicular hypofunction: Secondary | ICD-10-CM | POA: Diagnosis not present

## 2019-02-24 ENCOUNTER — Ambulatory Visit: Payer: BLUE CROSS/BLUE SHIELD | Admitting: Psychiatry

## 2019-03-03 ENCOUNTER — Ambulatory Visit: Payer: BLUE CROSS/BLUE SHIELD | Admitting: Psychiatry

## 2019-03-18 ENCOUNTER — Other Ambulatory Visit: Payer: Self-pay | Admitting: Psychiatry

## 2019-03-18 DIAGNOSIS — F410 Panic disorder [episodic paroxysmal anxiety] without agoraphobia: Secondary | ICD-10-CM

## 2019-03-19 DIAGNOSIS — E291 Testicular hypofunction: Secondary | ICD-10-CM | POA: Diagnosis not present

## 2019-03-19 NOTE — Telephone Encounter (Signed)
Last fill 03/24, a week early. Next visit 04/28

## 2019-03-31 ENCOUNTER — Ambulatory Visit (INDEPENDENT_AMBULATORY_CARE_PROVIDER_SITE_OTHER): Payer: BLUE CROSS/BLUE SHIELD | Admitting: Psychiatry

## 2019-03-31 ENCOUNTER — Encounter: Payer: Self-pay | Admitting: Psychiatry

## 2019-03-31 ENCOUNTER — Other Ambulatory Visit: Payer: Self-pay

## 2019-03-31 DIAGNOSIS — F410 Panic disorder [episodic paroxysmal anxiety] without agoraphobia: Secondary | ICD-10-CM | POA: Diagnosis not present

## 2019-03-31 MED ORDER — SERTRALINE HCL 50 MG PO TABS: 75.0000 mg | ORAL_TABLET | Freq: Every day | ORAL | 3 refills | Status: DC

## 2019-03-31 NOTE — Progress Notes (Signed)
Derek [name] [id number] 1961/08/30 58 y.o.  Virtual Visit via Telephone Note  I connected with@ on 03/31/19 at  1:30 PM EDT by telephone and verified that I am speaking with the correct person using two identifiers.   I discussed the limitations, risks, security and privacy concerns of performing an evaluation and management service by telephone and the availability of in person appointments. I also discussed with the patient that there may be a patient responsible charge related to this service. The patient expressed understanding and agreed to proceed.   I discussed the assessment and treatment plan with the patient. The patient was provided an opportunity to ask questions and all were answered. The patient agreed with the plan and demonstrated an understanding of the instructions.   The patient was advised to call back or seek an in-person evaluation if the symptoms worsen or if the condition fails to improve as anticipated.  I provided 30 minutes of non-face-to-face time during this encounter.  The patient was located at home.  The provider was located at home.   Corie Chiquito, PMHNP   Subjective:   Patient ID:  Derek [name] is a 58 y.o. (DOB 18-May-1961) male.  Chief Complaint: No chief complaint on file.   HPI Derek [name] presents for follow-up of anxiety and panic.  Patient reports that he started doing well until about the last month. Reports that he did very well in January and February started having increased anxiety about a month ago and was needing to take 1/2 Xanax before work, 1/2 tab when starting work and 1/2 tab in the afternoon. Reports that he was taking 1/2 tab po before work and then the remaining 1/2 tab when he gets to work and then was not needing any additional Xanax. He reports that he was able to manage his anxiety enough to get through his work day. Reports that his anxiety is typically better when he is at home and is occupied. He reports that when anxious it  feels as if his heart will skip a beat, that this triggers some hyper-ventilation. He reports adequate sleep. He reports that his appetite is stable and typically eats twice a day. He reports that his mood is stable. He reports that his energy and motivation have been good and he has been doing things around the house during furlough. Denies impaired concentration. Denies SI.   Reports that he has been furloughed since the last week.   Reports that he has felt "altered" with Propranolol. Reports that he was not able to tolerate Sertraline 100 mg po qd.   Review of Systems:  Review of Systems  Musculoskeletal: Negative for gait problem.  Neurological: Negative for tremors.  Psychiatric/Behavioral:       Please refer to HPI    Medications: I have reviewed the patient's current medications.  Current Outpatient Medications  Medication Sig Dispense Refill  . ALPRAZolam (XANAX) 1 MG tablet TAKE 1 TABLET (1 MG TOTAL) BY MOUTH 2 (TWO) TIMES DAILY AS NEEDED FOR ANXIETY. 60 tablet 1  . ELIQUIS 5 MG TABS tablet Take 5 mg by mouth 2 (two) times daily.  3  . sertraline (ZOLOFT) 50 MG tablet Take 1.5 tablets (75 mg total) by mouth daily for 30 days. 45 tablet 3   No current facility-administered medications for this visit.     Medication Side Effects: None  Allergies: No Known Allergies  Past Medical History:  Diagnosis Date  . Factor V Leiden (HCC)   .  History of DVT (deep vein thrombosis)   . History of panic attacks   . Left nephrolithiasis   . Low testosterone   . Right ureteral stone     Family History  Problem Relation Age of Onset  . Heart disease Paternal Grandmother        ? possible h/o heart attack  . Anxiety disorder Mother   . AAA (abdominal aortic aneurysm) Father   . Heart attack Father [id number]    Social History   Socioeconomic History  . Marital status: Single    Spouse name: Not on file  . Number of children: Not on file  . Years of education: Not on file  .  Highest education level: Not on file  Occupational History  . Not on file  Social Needs  . Financial resource strain: Not on file  . Food insecurity:    Worry: Not on file    Inability: Not on file  . Transportation needs:    Medical: Not on file    Non-medical: Not on file  Tobacco Use  . Smoking status: Former Smoker    Packs/day: 0.50    Years: 20.00    Pack years: 10.00    Types: Cigarettes    Last attempt to quit: 04/23/2009    Years since quitting: 9.9  . Smokeless tobacco: Current User    Types: Snuff, Chew  . Tobacco comment: occasionally dips / chewtobacco. Quit ~2015.  Substance and Sexual Activity  . Alcohol use: Yes    Alcohol/week: 0.0 standard drinks    Comment: occasional - 1-2 beers/week  . Drug use: No  . Sexual activity: Not on file  Lifestyle  . Physical activity:    Days per week: Not on file    Minutes per session: Not on file  . Stress: Not on file  Relationships  . Social connections:    Talks on phone: Not on file    Gets together: Not on file    Attends religious service: Not on file    Active member of club or organization: Not on file    Attends meetings of clubs or organizations: Not on file    Relationship status: Not on file  . Intimate partner violence:    Fear of current or ex partner: Not on file    Emotionally abused: Not on file    Physically abused: Not on file    Forced sexual activity: Not on file  Other Topics Concern  . Not on file  Social History Narrative  . Not on file    Past Medical History, Surgical history, Social history, and Family history were reviewed and updated as appropriate.   Please see review of systems for further details on the patient's review from today.   Objective:   Physical Exam:  There were no vitals taken for this visit.  Physical Exam Neurological:     Mental Status: He is alert and oriented to person, place, and time.     Cranial Nerves: No dysarthria.  Psychiatric:        Attention  and Perception: Attention normal.        Mood and Affect: Mood is anxious.        Speech: Speech normal.        Behavior: Behavior is cooperative.        Thought Content: Thought content normal. Thought content is not paranoid or delusional. Thought content does not include homicidal or suicidal ideation. Thought content does not include  homicidal or suicidal plan.        Cognition and Memory: Cognition and memory normal.        Judgment: Judgment normal.     Lab Review:     Component Value Date/Time   NA 138 07/29/2018 1039   K 4.4 07/29/2018 1039   CL 105 07/29/2018 1039   CO2 25 07/29/2018 1039   GLUCOSE 114 (H) 07/29/2018 1039   BUN 16 07/29/2018 1039   CREATININE 1.14 07/29/2018 1039   CALCIUM 8.7 (L) 07/29/2018 1039   GFRNONAA >60 07/29/2018 1039   GFRAA >60 07/29/2018 1039       Component Value Date/Time   WBC 8.7 07/29/2018 1039   RBC 5.15 07/29/2018 1039   HGB 15.3 07/29/2018 1039   HCT 46.6 07/29/2018 1039   PLT 279 07/29/2018 1039   MCV 90.5 07/29/2018 1039   MCV 88.1 11/24/2014 1913   MCH 29.7 07/29/2018 1039   MCHC 32.8 07/29/2018 1039   RDW 12.7 07/29/2018 1039   LYMPHSABS 4.8 (H) 04/22/2014 1455   MONOABS 0.7 04/22/2014 1455   EOSABS 0.4 04/22/2014 1455   BASOSABS 0.0 04/22/2014 1455    No results found for: POCLITH, LITHIUM   No results found for: PHENYTOIN, PHENOBARB, VALPROATE, CBMZ   .res Assessment: Plan:   Increase Sertraline to 75 mg p.o. daily since patient has been unable to tolerate sertraline at 100 mg daily, however he has continued to have breakthrough anxiety and.'s of exacerbation of panic on 50 mg daily.  Also discussed that it would be a good time to trial increase in sertraline while patient is on furlough this week. Continue Xanax 1 mg 1/2-1 tab p.o. twice daily as needed anxiety. Recommend follow-up in 3 months or sooner if clinically indicated.  Panic disorder without agoraphobia - Plan: sertraline (ZOLOFT) 50 MG  tablet  Please see After Visit Summary for patient specific instructions.  No future appointments.  No orders of the defined types were placed in this encounter.     -------------------------------

## 2019-04-14 ENCOUNTER — Other Ambulatory Visit: Payer: Self-pay | Admitting: Psychiatry

## 2019-04-14 DIAGNOSIS — F410 Panic disorder [episodic paroxysmal anxiety] without agoraphobia: Secondary | ICD-10-CM

## 2019-04-16 DIAGNOSIS — E291 Testicular hypofunction: Secondary | ICD-10-CM | POA: Diagnosis not present

## 2019-04-23 DIAGNOSIS — E291 Testicular hypofunction: Secondary | ICD-10-CM | POA: Diagnosis not present

## 2019-04-23 DIAGNOSIS — R0609 Other forms of dyspnea: Secondary | ICD-10-CM | POA: Diagnosis not present

## 2019-04-23 DIAGNOSIS — F419 Anxiety disorder, unspecified: Secondary | ICD-10-CM | POA: Diagnosis not present

## 2019-04-23 DIAGNOSIS — G4700 Insomnia, unspecified: Secondary | ICD-10-CM | POA: Diagnosis not present

## 2019-05-14 DIAGNOSIS — E291 Testicular hypofunction: Secondary | ICD-10-CM | POA: Diagnosis not present

## 2019-05-20 ENCOUNTER — Other Ambulatory Visit: Payer: Self-pay | Admitting: Psychiatry

## 2019-05-20 DIAGNOSIS — F410 Panic disorder [episodic paroxysmal anxiety] without agoraphobia: Secondary | ICD-10-CM

## 2019-06-10 DIAGNOSIS — E291 Testicular hypofunction: Secondary | ICD-10-CM | POA: Diagnosis not present

## 2019-07-02 DIAGNOSIS — E291 Testicular hypofunction: Secondary | ICD-10-CM | POA: Diagnosis not present

## 2019-07-17 ENCOUNTER — Other Ambulatory Visit: Payer: Self-pay | Admitting: Psychiatry

## 2019-07-17 DIAGNOSIS — F410 Panic disorder [episodic paroxysmal anxiety] without agoraphobia: Secondary | ICD-10-CM

## 2019-07-17 NOTE — Telephone Encounter (Signed)
Due for appt.

## 2019-07-23 DIAGNOSIS — E291 Testicular hypofunction: Secondary | ICD-10-CM | POA: Diagnosis not present

## 2019-08-18 DIAGNOSIS — E291 Testicular hypofunction: Secondary | ICD-10-CM | POA: Diagnosis not present

## 2019-09-15 ENCOUNTER — Other Ambulatory Visit: Payer: Self-pay | Admitting: Psychiatry

## 2019-09-15 DIAGNOSIS — F410 Panic disorder [episodic paroxysmal anxiety] without agoraphobia: Secondary | ICD-10-CM

## 2019-09-16 NOTE — Telephone Encounter (Signed)
Still needs to schedule office visit, not seen since 04/28

## 2019-09-22 DIAGNOSIS — E291 Testicular hypofunction: Secondary | ICD-10-CM | POA: Diagnosis not present

## 2019-10-14 DIAGNOSIS — E291 Testicular hypofunction: Secondary | ICD-10-CM | POA: Diagnosis not present

## 2019-10-16 DIAGNOSIS — E291 Testicular hypofunction: Secondary | ICD-10-CM | POA: Diagnosis not present

## 2019-10-16 DIAGNOSIS — Z0000 Encounter for general adult medical examination without abnormal findings: Secondary | ICD-10-CM | POA: Diagnosis not present

## 2019-10-16 DIAGNOSIS — R7989 Other specified abnormal findings of blood chemistry: Secondary | ICD-10-CM | POA: Diagnosis not present

## 2019-10-16 DIAGNOSIS — E875 Hyperkalemia: Secondary | ICD-10-CM | POA: Diagnosis not present

## 2019-10-16 DIAGNOSIS — Z125 Encounter for screening for malignant neoplasm of prostate: Secondary | ICD-10-CM | POA: Diagnosis not present

## 2019-10-23 DIAGNOSIS — Z7901 Long term (current) use of anticoagulants: Secondary | ICD-10-CM | POA: Diagnosis not present

## 2019-10-23 DIAGNOSIS — Z0000 Encounter for general adult medical examination without abnormal findings: Secondary | ICD-10-CM | POA: Diagnosis not present

## 2019-10-23 DIAGNOSIS — E875 Hyperkalemia: Secondary | ICD-10-CM | POA: Diagnosis not present

## 2019-10-23 DIAGNOSIS — F419 Anxiety disorder, unspecified: Secondary | ICD-10-CM | POA: Diagnosis not present

## 2019-10-23 DIAGNOSIS — D682 Hereditary deficiency of other clotting factors: Secondary | ICD-10-CM | POA: Diagnosis not present

## 2019-11-02 DIAGNOSIS — M25512 Pain in left shoulder: Secondary | ICD-10-CM | POA: Diagnosis not present

## 2019-11-04 DIAGNOSIS — M25512 Pain in left shoulder: Secondary | ICD-10-CM | POA: Diagnosis not present

## 2019-11-09 DIAGNOSIS — M25512 Pain in left shoulder: Secondary | ICD-10-CM | POA: Diagnosis not present

## 2019-11-18 ENCOUNTER — Other Ambulatory Visit: Payer: Self-pay | Admitting: Psychiatry

## 2019-11-18 DIAGNOSIS — F410 Panic disorder [episodic paroxysmal anxiety] without agoraphobia: Secondary | ICD-10-CM

## 2019-11-18 NOTE — Telephone Encounter (Signed)
Last apt April, nothing scheduled at this time

## 2019-11-19 DIAGNOSIS — E291 Testicular hypofunction: Secondary | ICD-10-CM | POA: Diagnosis not present

## 2019-12-09 DIAGNOSIS — M25512 Pain in left shoulder: Secondary | ICD-10-CM | POA: Diagnosis not present

## 2019-12-09 DIAGNOSIS — S46091A Other injury of muscle(s) and tendon(s) of the rotator cuff of right shoulder, initial encounter: Secondary | ICD-10-CM | POA: Diagnosis not present

## 2019-12-22 DIAGNOSIS — M7542 Impingement syndrome of left shoulder: Secondary | ICD-10-CM | POA: Diagnosis not present

## 2019-12-22 DIAGNOSIS — Z86718 Personal history of other venous thrombosis and embolism: Secondary | ICD-10-CM | POA: Diagnosis not present

## 2019-12-22 DIAGNOSIS — S46012A Strain of muscle(s) and tendon(s) of the rotator cuff of left shoulder, initial encounter: Secondary | ICD-10-CM | POA: Diagnosis not present

## 2019-12-22 DIAGNOSIS — M659 Synovitis and tenosynovitis, unspecified: Secondary | ICD-10-CM | POA: Diagnosis not present

## 2019-12-22 DIAGNOSIS — S43492A Other sprain of left shoulder joint, initial encounter: Secondary | ICD-10-CM | POA: Diagnosis not present

## 2019-12-22 DIAGNOSIS — I890 Lymphedema, not elsewhere classified: Secondary | ICD-10-CM | POA: Diagnosis not present

## 2019-12-22 DIAGNOSIS — M19012 Primary osteoarthritis, left shoulder: Secondary | ICD-10-CM | POA: Diagnosis not present

## 2019-12-22 DIAGNOSIS — M25511 Pain in right shoulder: Secondary | ICD-10-CM | POA: Diagnosis not present

## 2019-12-22 DIAGNOSIS — M65812 Other synovitis and tenosynovitis, left shoulder: Secondary | ICD-10-CM | POA: Diagnosis not present

## 2019-12-22 DIAGNOSIS — Y999 Unspecified external cause status: Secondary | ICD-10-CM | POA: Diagnosis not present

## 2019-12-22 DIAGNOSIS — X58XXXA Exposure to other specified factors, initial encounter: Secondary | ICD-10-CM | POA: Diagnosis not present

## 2019-12-22 DIAGNOSIS — Z4889 Encounter for other specified surgical aftercare: Secondary | ICD-10-CM | POA: Diagnosis not present

## 2019-12-22 DIAGNOSIS — G8918 Other acute postprocedural pain: Secondary | ICD-10-CM | POA: Diagnosis not present

## 2019-12-22 DIAGNOSIS — S43432A Superior glenoid labrum lesion of left shoulder, initial encounter: Secondary | ICD-10-CM | POA: Diagnosis not present

## 2019-12-29 DIAGNOSIS — E291 Testicular hypofunction: Secondary | ICD-10-CM | POA: Diagnosis not present

## 2020-01-01 DIAGNOSIS — M25512 Pain in left shoulder: Secondary | ICD-10-CM | POA: Diagnosis not present

## 2020-01-08 DIAGNOSIS — M25512 Pain in left shoulder: Secondary | ICD-10-CM | POA: Diagnosis not present

## 2020-01-15 DIAGNOSIS — M25512 Pain in left shoulder: Secondary | ICD-10-CM | POA: Diagnosis not present

## 2020-01-19 ENCOUNTER — Other Ambulatory Visit: Payer: Self-pay | Admitting: Psychiatry

## 2020-01-19 DIAGNOSIS — F410 Panic disorder [episodic paroxysmal anxiety] without agoraphobia: Secondary | ICD-10-CM

## 2020-01-19 NOTE — Telephone Encounter (Signed)
Last apt 03/2019 

## 2020-01-22 DIAGNOSIS — M25512 Pain in left shoulder: Secondary | ICD-10-CM | POA: Diagnosis not present

## 2020-01-27 DIAGNOSIS — E291 Testicular hypofunction: Secondary | ICD-10-CM | POA: Diagnosis not present

## 2020-01-29 DIAGNOSIS — M25512 Pain in left shoulder: Secondary | ICD-10-CM | POA: Diagnosis not present

## 2020-02-01 DIAGNOSIS — M25512 Pain in left shoulder: Secondary | ICD-10-CM | POA: Diagnosis not present

## 2020-02-09 DIAGNOSIS — M25512 Pain in left shoulder: Secondary | ICD-10-CM | POA: Diagnosis not present

## 2020-02-15 DIAGNOSIS — M25512 Pain in left shoulder: Secondary | ICD-10-CM | POA: Diagnosis not present

## 2020-02-17 DIAGNOSIS — M25512 Pain in left shoulder: Secondary | ICD-10-CM | POA: Diagnosis not present

## 2020-02-23 DIAGNOSIS — E291 Testicular hypofunction: Secondary | ICD-10-CM | POA: Diagnosis not present

## 2020-02-23 DIAGNOSIS — Z1389 Encounter for screening for other disorder: Secondary | ICD-10-CM | POA: Diagnosis not present

## 2020-02-23 DIAGNOSIS — F419 Anxiety disorder, unspecified: Secondary | ICD-10-CM | POA: Diagnosis not present

## 2020-02-23 DIAGNOSIS — I10 Essential (primary) hypertension: Secondary | ICD-10-CM | POA: Diagnosis not present

## 2020-02-24 DIAGNOSIS — M25512 Pain in left shoulder: Secondary | ICD-10-CM | POA: Diagnosis not present

## 2020-03-19 ENCOUNTER — Other Ambulatory Visit: Payer: Self-pay | Admitting: Psychiatry

## 2020-03-19 DIAGNOSIS — F410 Panic disorder [episodic paroxysmal anxiety] without agoraphobia: Secondary | ICD-10-CM

## 2020-03-21 NOTE — Telephone Encounter (Signed)
Last apt 03/2019 

## 2020-03-25 ENCOUNTER — Other Ambulatory Visit: Payer: Self-pay | Admitting: Psychiatry

## 2020-03-25 DIAGNOSIS — F410 Panic disorder [episodic paroxysmal anxiety] without agoraphobia: Secondary | ICD-10-CM

## 2020-03-31 ENCOUNTER — Ambulatory Visit (INDEPENDENT_AMBULATORY_CARE_PROVIDER_SITE_OTHER): Payer: BC Managed Care – PPO | Admitting: Psychiatry

## 2020-03-31 ENCOUNTER — Encounter: Payer: Self-pay | Admitting: Psychiatry

## 2020-03-31 ENCOUNTER — Other Ambulatory Visit: Payer: Self-pay

## 2020-03-31 DIAGNOSIS — F410 Panic disorder [episodic paroxysmal anxiety] without agoraphobia: Secondary | ICD-10-CM

## 2020-03-31 MED ORDER — ALPRAZOLAM 1 MG PO TABS: ORAL_TABLET | ORAL | 5 refills | Status: DC

## 2020-03-31 NOTE — Progress Notes (Signed)
Derek [name] [id number] [birth year] 59 y.o.  Subjective:   Patient ID:  Derek [name] is a 59 y.o. (DOB Jun 10, 1961) male.  Chief Complaint:  Chief Complaint  Patient presents with  . Follow-up    h/o Panic d/o     HPI Derek [name] presents to the office today for follow-up of anxiety and panic. He reports that anxiety has been "hit and miss." He reports that he had some anxiety last summer. He reports that his anxiety is typically better in the winter months.   Had rotator surgery in December and was out of work for 8 weeks and did not have anxiety then. He reports that as soon as he returned to work his anxiety returned which is puzzling to him. Questions if it may be related to being around more people. He avoids going to large gatherings because this triggers anxiety. Reports that he is usually ok in smaller settings, such as a restaurant. He tried to increase Sertraline to 100 mg po qd and had adverse effects. He reports he will occ hyperventilate at work, even when anxiety has been minimal earlier in the day. He reports that overall anxiety has been manageable. Denies significant worry.   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 Takes Sertraline 50 mg upon awakening. He typically takes 1/2 tab in the morning and another 1/2 about 5 miles before getting to work. He reports that about 50% of the time he will take 2 tabs and 50% of the time he will take less than 2 tabs.   Denies depressed mood. Adequate sleep. Appetite has been ok. Energy and motivation have been good. Concentration has been adequate.   Denies SI.   Son is doing well. Family has been doing ok. Work has been supportive. Has been at current job 32 years.  PCP has recommended a sleep study.     Review of Systems:  Review of Systems  Gastrointestinal: Negative.   Musculoskeletal: Negative for gait problem.       Had shoulder surgery and pain has resolved.    Neurological: Negative for tremors.  Psychiatric/Behavioral:       Please refer to HPI    Had 2nd covid vaccination.   Medications: I have reviewed the patient's current medications.  Current Outpatient Medications  Medication Sig Dispense Refill  . ALPRAZolam (XANAX) 1 MG tablet TAKE 1 TABLET BY MOUTH TWICE A DAY AS NEEDED FOR ANXIETY 60 tablet 5  . ELIQUIS 5 MG TABS tablet Take 5 mg by mouth 2 (two) times daily.  3  . sertraline (ZOLOFT) 50 MG tablet TAKE 1 AND 1/2 TABLETS (75 MG) BY MOUTH DAILY FOR 30 DAYS. (Patient taking differently: Take 50 mg by mouth daily. ) 135 tablet 0   No current facility-administered medications for this visit.    Medication Side Effects: None  Allergies: No Known Allergies  Past Medical History:  Diagnosis Date  . Factor V Leiden (HCC)   . History of DVT (deep vein thrombosis)   . History of panic attacks   . Left nephrolithiasis   . Low testosterone   . Right ureteral stone     Family History  Problem Relation Age of Onset  . Heart disease Paternal Grandmother        ? possible h/o heart attack  . Anxiety disorder Mother   . AAA (abdominal aortic aneurysm) Father   . Heart attack Father 30  Social History   Socioeconomic History  . Marital status: Single    Spouse name: Not on file  . Number of children: Not on file  . Years of education: Not on file  . Highest education level: Not on file  Occupational History  . Not on file  Tobacco Use  . Smoking status: Former Smoker    Packs/day: 0.50    Years: 20.00    Pack years: 10.00    Types: Cigarettes    Quit date: 04/23/2009    Years since quitting: 10.9  . Smokeless tobacco: Current User    Types: Snuff, Chew  . Tobacco comment: occasionally dips / chewtobacco. Quit ~2015.  Substance and Sexual Activity  . Alcohol use: Yes    Alcohol/week: 0.0 standard drinks    Comment: occasional - 1-2 beers/week  . Drug use: No  . Sexual activity: Not on file  Other Topics Concern   . Not on file  Social History Narrative  . Not on file   Social Determinants of Health   Financial Resource Strain:   . Difficulty of Paying Living Expenses:   Food Insecurity:   . Worried About Programme researcher, broadcasting/film/video in the Last Year:   . Barista in the Last Year:   Transportation Needs:   . Freight forwarder (Medical):   Marland Kitchen Lack of Transportation (Non-Medical):   Physical Activity:   . Days of Exercise per Week:   . Minutes of Exercise per Session:   Stress:   . Feeling of Stress :   Social Connections:   . Frequency of Communication with Friends and Family:   . Frequency of Social Gatherings with Friends and Family:   . Attends Religious Services:   . Active Member of Clubs or Organizations:   . Attends Banker Meetings:   Marland Kitchen Marital Status:   Intimate Partner Violence:   . Fear of Current or Ex-Partner:   . Emotionally Abused:   Marland Kitchen Physically Abused:   . Sexually Abused:     Past Medical History, Surgical history, Social history, and Family history were reviewed and updated as appropriate.   Please see review of systems for further details on the patient's review from today.   Objective:   Physical Exam:  BP (!) 140/93   Pulse 69   Wt 235 lb (106.6 kg)   BMI 31.87 kg/m   Physical Exam Constitutional:      General: He is not in acute distress. Musculoskeletal:        General: No deformity.  Neurological:     Mental Status: He is alert and oriented to person, place, and time.     Coordination: Coordination normal.  Psychiatric:        Attention and Perception: Attention and perception normal. He does not perceive auditory or visual hallucinations.        Mood and Affect: Mood normal. Mood is not anxious or depressed. Affect is not labile, blunt, angry or inappropriate.        Speech: Speech normal.        Behavior: Behavior normal.        Thought Content: Thought content normal. Thought content is not paranoid or delusional. Thought  content does not include homicidal or suicidal ideation. Thought content does not include homicidal or suicidal plan.        Cognition and Memory: Cognition and memory normal.        Judgment: Judgment normal.  Comments: Insight intact     Lab Review:     Component Value Date/Time   NA 138 07/29/2018 1039   K 4.4 07/29/2018 1039   CL 105 07/29/2018 1039   CO2 25 07/29/2018 1039   GLUCOSE 114 (H) 07/29/2018 1039   BUN 16 07/29/2018 1039   CREATININE 1.14 07/29/2018 1039   CALCIUM 8.7 (L) 07/29/2018 1039   GFRNONAA >60 07/29/2018 1039   GFRAA >60 07/29/2018 1039       Component Value Date/Time   WBC 8.7 07/29/2018 1039   RBC 5.15 07/29/2018 1039   HGB 15.3 07/29/2018 1039   HCT 46.6 07/29/2018 1039   PLT 279 07/29/2018 1039   MCV 90.5 07/29/2018 1039   MCV 88.1 11/24/2014 1913   MCH 29.7 07/29/2018 1039   MCHC 32.8 07/29/2018 1039   RDW 12.7 07/29/2018 1039   LYMPHSABS 4.8 (H) 04/22/2014 1455   MONOABS 0.7 04/22/2014 1455   EOSABS 0.4 04/22/2014 1455   BASOSABS 0.0 04/22/2014 1455    No results found for: POCLITH, LITHIUM   No results found for: PHENYTOIN, PHENOBARB, VALPROATE, CBMZ   .res Assessment: Plan:   Pt seen for 30 minutes and time spent counseling pt re: anxiety and possible tx options when exacerbations occur. Answered pt's questions about possible future treatments for anxiety. Discussed pharmacogenetic testing and that may be helpful with determining future tx changes since pt has h/o adverse effects with multiple medications and limited improvement. Pt reports that he would like to inquire about cost and insurance coverage. Pt provided with contact information for [facility] and advised to contact office if he would like to complete pharmacogenetic testing. Recommend continuing Xanax 1 mg po BID prn anxiety.  Recommend continuing Sertraline 50 mg po qd for panic d/o. Pt to f/u in 6 months or sooner if clinically indicated.  Patient advised to contact  office with any questions, adverse effects, or acute worsening in signs and symptoms.   Jaxon was seen today for follow-up.  Diagnoses and all orders for this visit:  Panic disorder -     ALPRAZolam (XANAX) 1 MG tablet; TAKE 1 TABLET BY MOUTH TWICE A DAY AS NEEDED FOR ANXIETY     Please see After Visit Summary for patient specific instructions.  Future Appointments  Date Time Provider Orient  09/30/2020  1:00 PM Thayer Headings, PMHNP [facility] None    No orders of the defined types were placed in this encounter.   -------------------------------

## 2020-04-05 DIAGNOSIS — E291 Testicular hypofunction: Secondary | ICD-10-CM | POA: Diagnosis not present

## 2020-05-17 DIAGNOSIS — E291 Testicular hypofunction: Secondary | ICD-10-CM | POA: Diagnosis not present

## 2020-06-07 IMAGING — DX DG CHEST 2V
2 series · 2 of 2 positions shown · non-contrast
Comparison: CT scan of the chest May 05, 2018 and PA and lateral
chest x-ray November 24, 2014.

CLINICAL DATA: Shortness of breath and chest pains for the past 3-4
days. Recent lower extremity venous thrombosis. Former smoker.

EXAM:
CHEST - 2 VIEW

[chest pa]
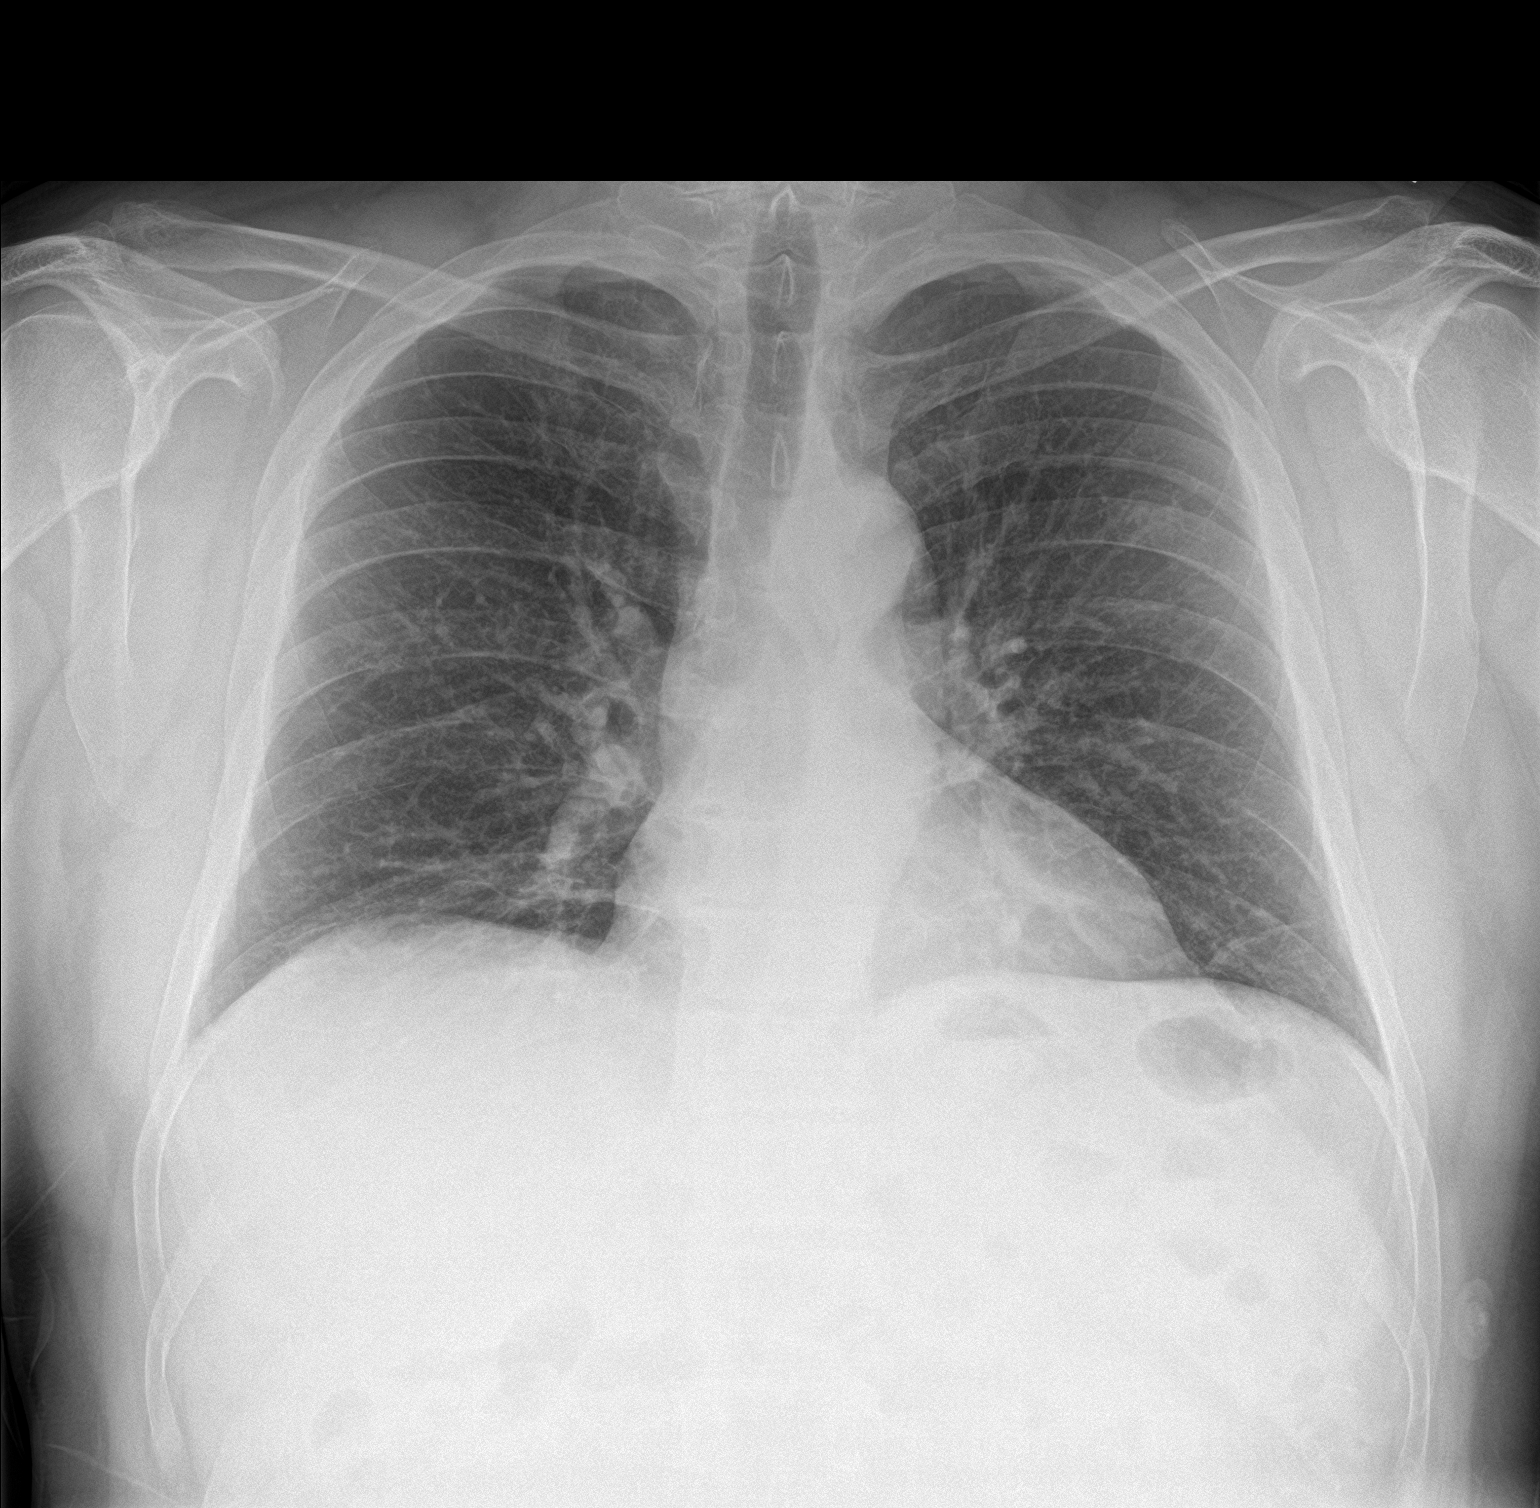

[chest lat]
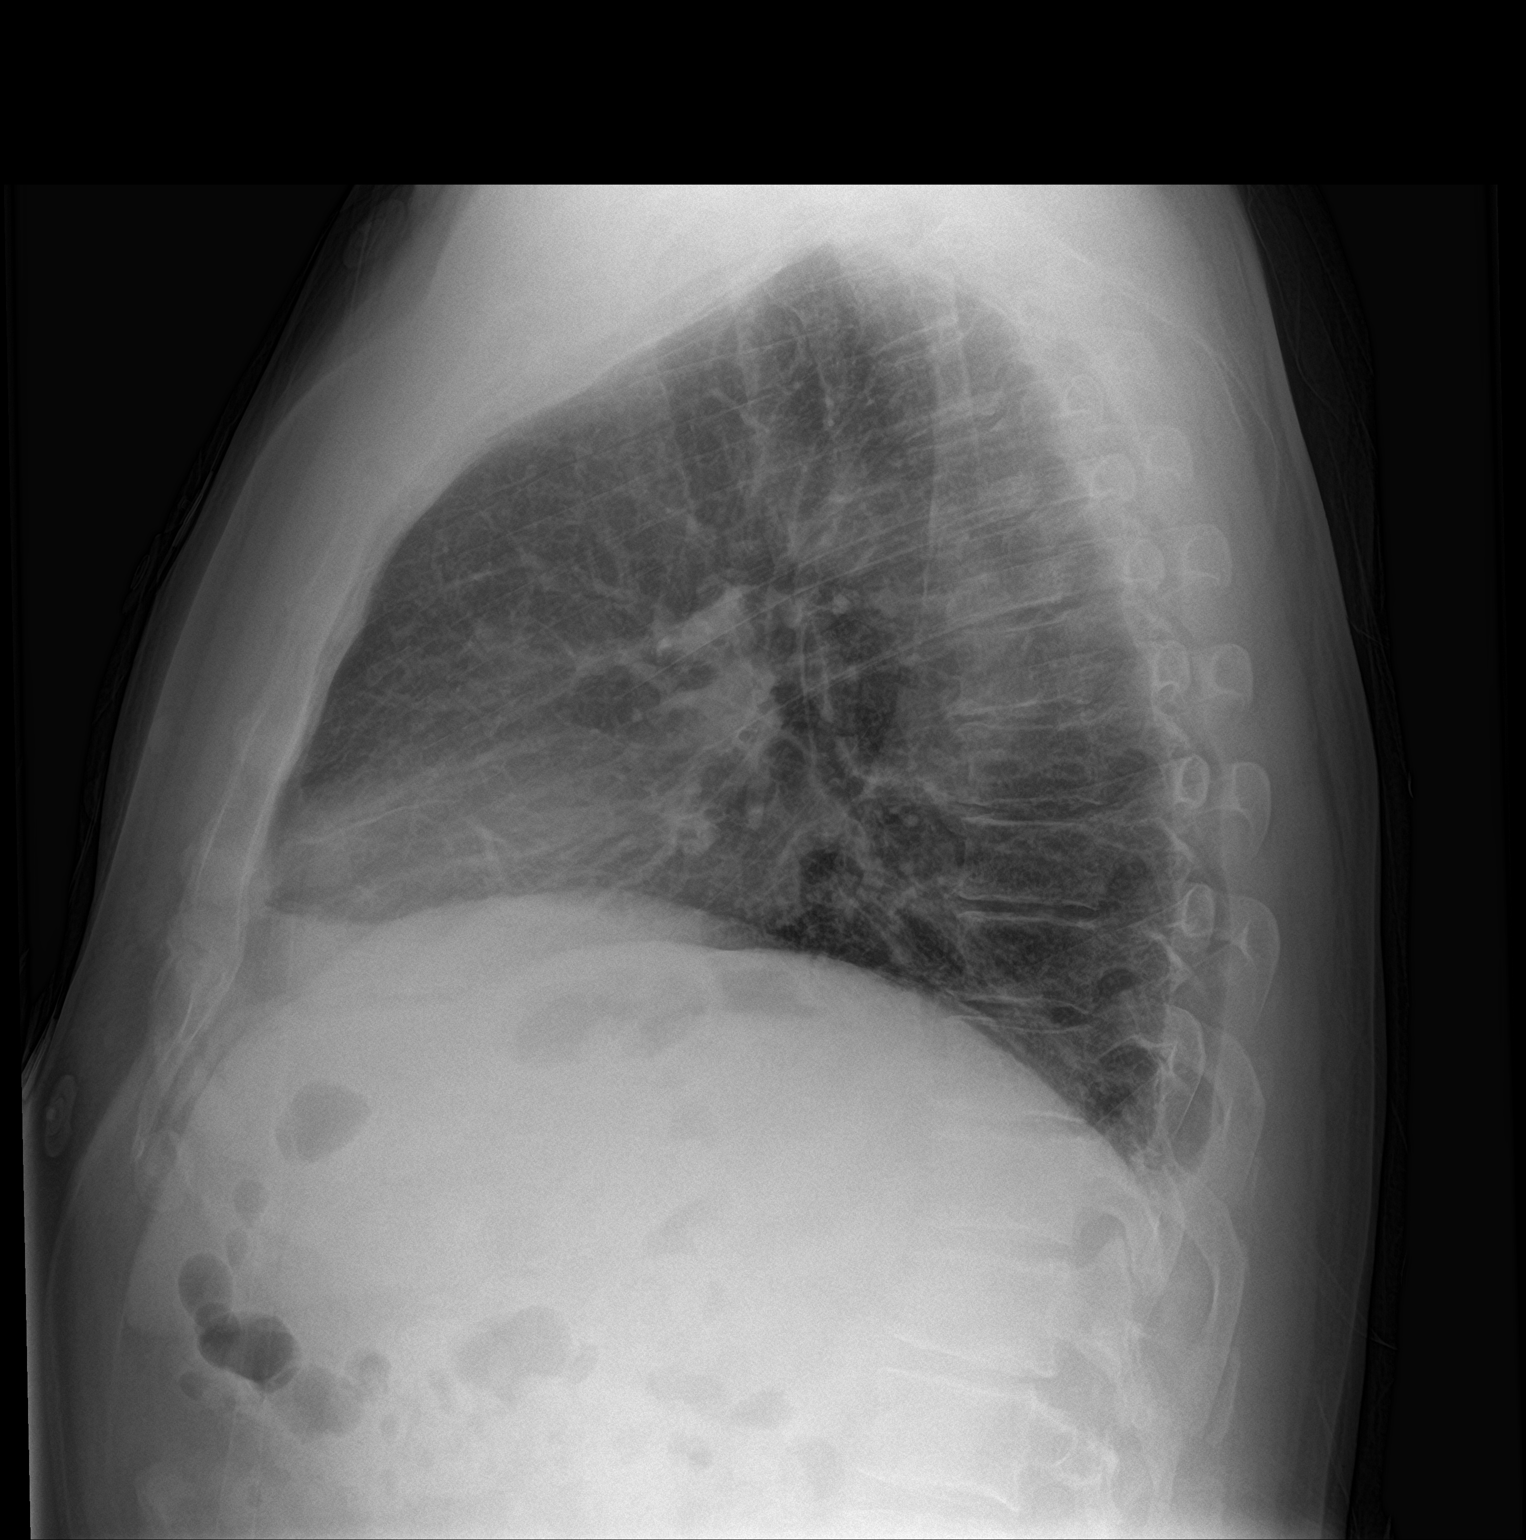

[2 of 2 positions shown; findings below may reference images not displayed]

FINDINGS: The lungs are mildly hypoinflated. The interstitial markings are
slightly increased just above the right hemidiaphragm. The heart and
pulmonary vascularity are normal. The mediastinum is normal in
width. The trachea is midline. There is no pleural effusion. The
bony thorax exhibits no acute abnormality.
IMPRESSION: Mild hypoinflation.  No definite acute cardiopulmonary abnormality.

## 2020-06-15 DIAGNOSIS — E291 Testicular hypofunction: Secondary | ICD-10-CM | POA: Diagnosis not present

## 2020-07-12 DIAGNOSIS — Z03818 Encounter for observation for suspected exposure to other biological agents ruled out: Secondary | ICD-10-CM | POA: Diagnosis not present

## 2020-07-12 DIAGNOSIS — Z20822 Contact with and (suspected) exposure to covid-19: Secondary | ICD-10-CM | POA: Diagnosis not present

## 2020-07-19 ENCOUNTER — Ambulatory Visit (INDEPENDENT_AMBULATORY_CARE_PROVIDER_SITE_OTHER): Payer: BC Managed Care – PPO | Admitting: Neurology

## 2020-07-19 ENCOUNTER — Encounter: Payer: Self-pay | Admitting: Neurology

## 2020-07-19 VITALS — BP 122/82 | HR 96 | Ht 72.0 in | Wt 245.0 lb

## 2020-07-19 DIAGNOSIS — F419 Anxiety disorder, unspecified: Secondary | ICD-10-CM | POA: Diagnosis not present

## 2020-07-19 DIAGNOSIS — G478 Other sleep disorders: Secondary | ICD-10-CM

## 2020-07-19 DIAGNOSIS — E669 Obesity, unspecified: Secondary | ICD-10-CM | POA: Diagnosis not present

## 2020-07-19 DIAGNOSIS — R0683 Snoring: Secondary | ICD-10-CM | POA: Diagnosis not present

## 2020-07-19 NOTE — Progress Notes (Signed)
Subjective:    Patient ID: Derek [name] is a 59 y.o. male.  HPI     Interim history:   Derek [name] is a 59 year old right-handed gentleman with an underlying medical history of anxiety and panic attacks, thrombophlebitis, factor V Leiden, on Eliquis, prior smoking, and obesity, who Presents for reevaluation of his sleep disorder, in particular, concern for underlying obstructive sleep apnea in the context of snoring, anxiety, and a family history of sleep apnea.  The patient is unaccompanied today.  I first met him at the request of his primary care physician on 01/29/2019, at which time he was advised to proceed with a sleep study.  He did not complete the study at the time.  Today, 07/19/2020: He reports that he did not pursue the sleep study because of the cost of the test.  He is willing to get tested now.  He still has issues with anxiety, most of the time manageable with a combination of sertraline and alprazolam as needed.  He goes to bed around 9 and rise time is around 330.  He is divorced and lives alone.  He drinks caffeine in the form of soda, 1 serving per day.  He denies night to night nocturia or recurrent morning headaches.  He is not aware of any family history of sleep apnea.  The patient's allergies, current medications, family history, past medical history, past social history, past surgical history and problem list were reviewed and updated as appropriate.    Previously:  01/29/19: (He) reports having worsening anxiety and panic attacks. He is wondering if an underlying sleep disorder may be a contributor. He does believe he snores. He reports a family history of sleep apnea and some maternal uncles. He has a strong family history of factor V Leiden. He has seen psychiatry for his anxiety and panic disorder and has tried multiple different medications which at times cause side effects or no help. He is currently on Xanax 1 mg strength half a pill up to 4 times a day. He also takes  sertraline 100 mg strength half pill daily. He has had counseling as well. I reviewed your office note from 10/20/2018, which you kindly included. He was 4/25 years, he lives alone. He has a 74 year old son who is married. He has no grandchildren. He does watch TV in his bedroom and the TV turns off on a timer at night. Bedtime is generally around 9 PM and rise time around 4:30 AM. He denies night to night nocturia morning headaches but does not wake up rested. He works for [employer]. He uses smokeless tobacco. He drinks alcohol about 4 or 5 beers per week on average, caffeine in the form of soda, 2-3 per day on average. His Epworth sleepiness score is 3 out of 24 today, fatigue score is 27 out of 63. He has gained about 10 pounds in the past 6 months.  His Past Medical History Is Significant For: Past Medical History:  Diagnosis Date  . Factor V Leiden ([place])   . History of DVT (deep vein thrombosis)   . History of panic attacks   . Left nephrolithiasis   . Low testosterone   . Right ureteral stone     His Past Surgical History Is Significant For: Past Surgical History:  Procedure Laterality Date  . APPENDECTOMY    . CYSTO/  RIGHT RETROGRADE PYELOGRAM/  RIGHT URETERAL STENT PLACEMENT  04-23-2014  . CYSTOSCOPY W/ URETERAL STENT PLACEMENT Right 04/23/2014   Procedure:  CYSTOSCOPY  RIGHT RETROGRADE PYELOGRAM/RIGHT URETERAL STENT PLACEMENT;  Surgeon: Sharyn Creamer, MD;  Location: [facility];  Service: Urology;  Laterality: Right;  . CYSTOSCOPY WITH RETROGRADE PYELOGRAM, URETEROSCOPY AND STENT PLACEMENT Right 05/03/2014   Procedure: CYSTOSCOPY WITH RETROGRADE PYELOGRAM, URETEROSCOPY AND STENT EXCHANGE;  Surgeon: Sharyn Creamer, MD;  Location: [facility];  Service: Urology;  Laterality: Right;  . HOLMIUM LASER APPLICATION Right 04/09/8501   Procedure: HOLMIUM LASER APPLICATION;  Surgeon: Sharyn Creamer, MD;  Location: [facility];  Service:  Urology;  Laterality: Right;  . ROTATOR CUFF REPAIR      His Family History Is Significant For: Family History  Problem Relation Age of Onset  . Heart disease Paternal Grandmother        ? possible h/o heart attack  . Anxiety disorder Mother   . AAA (abdominal aortic aneurysm) Father   . Heart attack Father 65    His Social History Is Significant For: Social History   Socioeconomic History  . Marital status: Single    Spouse name: Not on file  . Number of children: Not on file  . Years of education: Not on file  . Highest education level: Not on file  Occupational History  . Not on file  Tobacco Use  . Smoking status: Former Smoker    Packs/day: 0.50    Years: 20.00    Pack years: 10.00    Types: Cigarettes    Quit date: 04/23/2009    Years since quitting: 11.2  . Smokeless tobacco: Current User    Types: Snuff, Chew  . Tobacco comment: occasionally dips / chewtobacco. Quit ~2015.  Substance and Sexual Activity  . Alcohol use: Yes    Alcohol/week: 0.0 standard drinks    Comment: occasional - 1-2 beers/week  . Drug use: No  . Sexual activity: Not on file  Other Topics Concern  . Not on file  Social History Narrative  . Not on file   Social Determinants of Health   Financial Resource Strain:   . Difficulty of Paying Living Expenses:   Food Insecurity:   . Worried About Charity fundraiser in the Last Year:   . Arboriculturist in the Last Year:   Transportation Needs:   . Film/video editor (Medical):   Marland Kitchen Lack of Transportation (Non-Medical):   Physical Activity:   . Days of Exercise per Week:   . Minutes of Exercise per Session:   Stress:   . Feeling of Stress :   Social Connections:   . Frequency of Communication with Friends and Family:   . Frequency of Social Gatherings with Friends and Family:   . Attends Religious Services:   . Active Member of Clubs or Organizations:   . Attends Archivist Meetings:   Marland Kitchen Marital Status:     His  Allergies Are:  No Known Allergies:   His Current Medications Are:  Outpatient Encounter Medications as of 07/19/2020  Medication Sig  . ALPRAZolam (XANAX) 1 MG tablet TAKE 1 TABLET BY MOUTH TWICE A DAY AS NEEDED FOR ANXIETY  . ELIQUIS 5 MG TABS tablet Take 5 mg by mouth 2 (two) times daily.  . sertraline (ZOLOFT) 50 MG tablet TAKE 1 AND 1/2 TABLETS (75 MG) BY MOUTH DAILY FOR 30 DAYS. (Patient taking differently: Take 50 mg by mouth daily. )   No facility-administered encounter medications on file as of 07/19/2020.  :  Review of Systems:  Out of a complete 14 point review of systems, all are reviewed and negative with the exception of these symptoms as listed below: Review of Systems  Neurological:       Here to discuss pursuing sleep study. Pt's last visit was 01/29/2019, sleep study was recommended at that time, but pt did not pursue.  Epworth Sleepiness Scale 0= would never doze 1= slight chance of dozing 2= moderate chance of dozing 3= high chance of dozing  Sitting and reading:0 Watching TV:0 Sitting inactive in a public place (ex. Theater or meeting):1 As a passenger in a car for an hour without a break:0 Lying down to rest in the afternoon:2 Sitting and talking to someone:0 Sitting quietly after lunch (no alcohol):0 In a car, while stopped in traffic:0 Total:3     Objective:  Neurological Exam  Physical Exam Physical Examination:   Vitals:   07/19/20 1141  BP: 122/82  Pulse: 96  SpO2: 94%   General Examination: The patient is a very pleasant 59 y.o. male in no acute distress. He appears well-developed and well-nourished and well groomed.   HEENT: Normocephalic, atraumatic, pupils are equal, round and reactive to light, extraocular tracking is good without limitation to gaze excursion or nystagmus noted. Normal smooth pursuit is noted. Hearing is grossly intact. Face is symmetric with normal facial animation and normal facial sensation. Speech is clear with no  dysarthria noted. There is no hypophonia. There is no lip, neck/head, jaw or voice tremor. Neck is supple with full range of passive and active motion. There are no carotid bruits on auscultation. Oropharynx exam reveals: no mouth dryness, adequate dental hygiene and moderate airway crowding, due to Larger uvula, redundant soft palate, tonsils in place, about 1+ bilaterally. He has mild pharyngeal and uvula erythema. Mallampati is class II. Tongue protrudes centrally and palate elevates symmetrically. Neck size is 18 1/8 inches. He has a minimal overbite.    Chest: Clear to auscultation without wheezing, rhonchi or crackles noted.  Heart: S1+S2+0, regular and normal without murmurs, rubs or gallops noted.   Abdomen: Soft, non-tender and non-distended with normal bowel sounds appreciated on auscultation.  Extremities: There is no pitting edema in the distal lower extremities bilaterally. Right distal leg with slight discoloration and hyperpigmentation, also was slightly larger in caliber than left. He had DVTs or superficial thrombophlebitis in the right leg. Stable findings.   Skin: Warm and dry without trophic changes noted.  Musculoskeletal: exam reveals no obvious joint deformities, tenderness or joint swelling or erythema. S/p L shoulder surgery.  Neurologically:  Mental status: The patient is awake, alert and oriented in all 4 spheres. His immediate and remote memory, attention, language skills and fund of knowledge are appropriate. There is no evidence of aphasia, agnosia, apraxia or anomia. Speech is clear with normal prosody and enunciation. Thought process is linear. Mood is normal and affect is normal.  Cranial nerves II - XII are as described above under HEENT exam. Motor exam: Normal bulk, strength and tone is noted. There is no drift, tremor or rebound. Romberg is negative. Fine motor skills and coordination: intact with normal finger taps, normal hand movements, normal rapid  alternating patting, normal foot taps and normal foot agility.  Cerebellar testing: No dysmetria or intention tremor. There is no truncal or gait ataxia.  Sensory exam: intact to light touch in the upper and lower extremities.  Gait, station and balance: He stands easily. No veering to one side is noted. No leaning to one side is  noted. Posture is age-appropriate and stance is narrow based. Gait shows normal stride length and normal pace. No problems turning are noted. Tandem walk is slightly challenging for him. He is wearing his work boots.   Assessment and Plan:  In summary, Derek [name] is a very pleasant 59 year old male with an underlying medical history of anxiety and panic attacks, thrombophlebitis, factor V Leiden, on Eliquis, prior smoking, and obesity, who presents for reevaluation of his sleep disturbance, in particular, concern for underlying obstructive sleep apnea.  He is particularly bothered by nonrestorative sleep and feeling anxious at night and first thing in the morning.  He is willing to pursue sleep study testing at this time.  We talked about sleep apnea, its prognosis and treatment options.  He is advised that even treating mild sleep apnea can improve symptoms in some patients and would be worthwhile treating.  He would be willing to try treatment for sleep apnea.  We will call him to schedule the test soon and follow-up afterwards.  I answered all his questions today and he was in agreement.   I spent 20 minutes in total face-to-face time and in reviewing records during pre-charting, more than 50% of which was spent in counseling and coordination of care, reviewing test results, reviewing medications and treatment regimen and/or in discussing or reviewing the diagnosis of OSA, the prognosis and treatment options. Pertinent laboratory and imaging test results that were available during this visit with the patient were reviewed by me and considered in my medical decision making (see  chart for details).

## 2020-07-19 NOTE — Patient Instructions (Signed)
As discussed, I will order a sleep study and we will look for signs of sleep apnea.  If you have sleep apnea, I would like for you to consider CPAP therapy or what we call an AutoPap machine.  Even treating mild sleep apnea can improve symptoms such as sleep disruption, daytime sleepiness, and sometimes even improve mental health and mood disorder such as anxiety.

## 2020-07-21 ENCOUNTER — Telehealth: Payer: Self-pay

## 2020-07-21 NOTE — Telephone Encounter (Signed)
LVM for pt to call me back to schedule sleep study  

## 2020-07-26 DIAGNOSIS — E291 Testicular hypofunction: Secondary | ICD-10-CM | POA: Diagnosis not present

## 2020-08-02 ENCOUNTER — Ambulatory Visit (INDEPENDENT_AMBULATORY_CARE_PROVIDER_SITE_OTHER): Payer: BC Managed Care – PPO | Admitting: Neurology

## 2020-08-02 DIAGNOSIS — F419 Anxiety disorder, unspecified: Secondary | ICD-10-CM

## 2020-08-02 DIAGNOSIS — G478 Other sleep disorders: Secondary | ICD-10-CM

## 2020-08-02 DIAGNOSIS — R0683 Snoring: Secondary | ICD-10-CM

## 2020-08-02 DIAGNOSIS — E669 Obesity, unspecified: Secondary | ICD-10-CM

## 2020-08-02 DIAGNOSIS — G4733 Obstructive sleep apnea (adult) (pediatric): Secondary | ICD-10-CM | POA: Diagnosis not present

## 2020-08-02 DIAGNOSIS — G4720 Circadian rhythm sleep disorder, unspecified type: Secondary | ICD-10-CM

## 2020-08-11 NOTE — Procedures (Signed)
PATIENT'S NAME:  Derek [name], Derek [name] DOB:      [birth year]      MR#:    [id number]     DATE OF RECORDING: 08/02/2020 REFERRING M.D.:  Jarome Matin MD Study Performed:   Baseline Polysomnogram HISTORY: 59 year old man with a history of anxiety and panic attacks, thrombophlebitis, factor V Leiden, on Eliquis, prior smoking, and obesity, who reports snoring, anxiety, and a family history of sleep apnea. The patient endorsed the Epworth Sleepiness Scale at 3 points. The patient's weight 245 pounds with a height of 72 (inches), resulting in a BMI of 33.1 kg/m2. The patient's neck circumference measured 18.2 inches.  CURRENT MEDICATIONS: Xanax, Eliquis, Zoloft   PROCEDURE:  This is a multichannel digital polysomnogram utilizing the Somnostar 11.2 system.  Electrodes and sensors were applied and monitored per AASM Specifications.   EEG, EOG, Chin and Limb EMG, were sampled at 200 Hz.  ECG, Snore and Nasal Pressure, Thermal Airflow, Respiratory Effort, CPAP Flow and Pressure, Oximetry was sampled at 50 Hz. Digital video and audio were recorded.      BASELINE STUDY  Lights Out was at 20:46 and Lights On at 03:40.  Total recording time (TRT) was 414 minutes, with a total sleep time (TST) of 313.5 minutes.   The patient's sleep latency was 42 minutes, which is delayed. REM latency was 98 minutes, which is normal. The sleep efficiency was 75.7 %.     SLEEP ARCHITECTURE: WASO (Wake after sleep onset) was 64 minutes with mild to moderate sleep fragmentation noted. There were 100.5 minutes in Stage N1, 104.5 minutes Stage N2, 45 minutes Stage N3 and 63.5 minutes in Stage REM.  The percentage of Stage N1 was 32.1%, which is markedly increased, Stage N2 was 33.3%, Stage N3 was 14.4% and Stage R (REM sleep) was 20.3%, which is normal. The arousals were noted as: 59 were spontaneous, 0 were associated with PLMs, 23 were associated with respiratory events.  RESPIRATORY ANALYSIS:  There were a total of 53 respiratory events:  3  obstructive apneas, 0 central apneas and 0 mixed apneas with a total of 3 apneas and an apnea index (AI) of .6 /hour. There were 50 hypopneas with a hypopnea index of 9.6 /hour. The patient also had 0 respiratory event related arousals (RERAs).      The total APNEA/HYPOPNEA INDEX (AHI) was 10.1/hour and the total RESPIRATORY DISTURBANCE INDEX was  10.1 /hour.  10 events occurred in REM sleep and 80 events in NREM. The REM AHI was  9.4 /hour, versus a non-REM AHI of 10.32. The patient spent 0 minutes of total sleep time in the supine position and 314 minutes in non-supine.. The supine AHI was n/a versus a non-supine AHI of 10.2.  OXYGEN SATURATION & C02:  The Wake baseline 02 saturation was 94%, with the lowest being 86% (64% on technical report appeared to be an error). Time spent below 89% saturation equaled 20 minutes (which is overestimated).  PERIODIC LIMB MOVEMENTS: The patient had a total of 0 Periodic Limb Movements.  The Periodic Limb Movement (PLM) index was 0 and the PLM Arousal index was 0/hour.  Audio and video analysis did not show any abnormal or unusual movements, behaviors, phonations or vocalizations, with the exception of one brief episode of sleep talking late in the study during REM. The patient took no bathroom breaks. Moderate to loud snoring was noted. The EKG was in keeping with normal sinus rhythm (NSR).  Post-study, the patient indicated that sleep was worse than  usual.  IMPRESSION:  1. Obstructive Sleep Apnea (OSA) 2. Dysfunctions associated with sleep stages or arousal from sleep  RECOMMENDATIONS:  1. This study demonstrates overall mild obstructive sleep apnea, with a total AHI of 10.1/hour, and O2 nadir of 86%. The absence of supine sleep likely underestimates his AHI and O2 nadir. Given the patient's medical history and sleep related complaints, treatment with positive airway pressure is recommended; this can be achieved in the form of autoPAP. Alternatively, a  full-night CPAP titration study would allow optimization of therapy if needed. Other treatment options may include avoidance of supine sleep position along with weight loss, upper airway or jaw surgery in selected patients or the use of an oral appliance in certain patients. ENT evaluation and/or consultation with a maxillofacial surgeon or dentist may be feasible in some instances.    2. Please note that untreated obstructive sleep apnea may carry additional perioperative morbidity. Patients with significant obstructive sleep apnea should receive perioperative PAP therapy and the surgeons and particularly the anesthesiologist should be informed of the diagnosis and the severity of the sleep disordered breathing. 3. This study shows sleep fragmentation and abnormal sleep stage percentages; these are nonspecific findings and per se do not signify an intrinsic sleep disorder or a cause for the patient's sleep-related symptoms. Causes include (but are not limited to) the first night effect of the sleep study, circadian rhythm disturbances, medication effect or an underlying mood disorder or medical problem.  4. The patient should be cautioned not to drive, work at heights, or operate dangerous or heavy equipment when tired or sleepy. Review and reiteration of good sleep hygiene measures should be pursued with any patient. 5. The patient will be seen in follow-up by Dr. Frances Furbish at [facility] for discussion of the test results and further management strategies. The referring provider will be notified of the test results.  I certify that I have reviewed the entire raw data recording prior to the issuance of this report in accordance with the Standards of Accreditation of the American Academy of Sleep Medicine (AASM)  Huston Foley, MD, PhD Diplomat, American Board of Neurology and Sleep Medicine (Neurology and Sleep Medicine)

## 2020-08-11 NOTE — Progress Notes (Signed)
Patient referred by Dr. Eloise Harman, last seen by me on 07/19/20, diagnostic PSG on 08/02/20.   Please call and notify the patient that the recent sleep study showed obstructive sleep apnea. OSA is overall mild, but worth treating to see if he feels better after treatment. To that end I recommend treatment for this in the form of autoPAP, which means, that we don't have to bring him back for a second sleep study with CPAP, but will let him try an autoPAP machine at home, through a DME company (of his choice, or as per insurance requirement). The DME representative will educate him on how to use the machine, how to put the mask on, etc. I have placed an order in the chart. Please send referral, talk to patient, send report to referring MD. We will need a FU in sleep clinic for 10 weeks post-PAP set up, please arrange that with me or one of our NPs. Thanks,   Huston Foley, MD, PhD [facility])

## 2020-08-11 NOTE — Addendum Note (Signed)
Addended by: Huston Foley on: 08/11/2020 06:05 PM   Modules accepted: Orders

## 2020-08-15 ENCOUNTER — Telehealth: Payer: Self-pay

## 2020-08-15 NOTE — Telephone Encounter (Signed)
-----   Message from Huston Foley, MD sent at 08/11/2020  6:05 PM EDT ----- Patient referred by Dr. Eloise Harman, last seen by me on 07/19/20, diagnostic PSG on 08/02/20.   Please call and notify the patient that the recent sleep study showed obstructive sleep apnea. OSA is overall mild, but worth treating to see if he feels better after treatment. To that end I recommend treatment for this in the form of autoPAP, which means, that we don't have to bring him back for a second sleep study with CPAP, but will let him try an autoPAP machine at home, through a DME company (of his choice, or as per insurance requirement). The DME representative will educate him on how to use the machine, how to put the mask on, etc. I have placed an order in the chart. Please send referral, talk to patient, send report to referring MD. We will need a FU in sleep clinic for 10 weeks post-PAP set up, please arrange that with me or one of our NPs. Thanks,   Huston Foley, MD, PhD [facility])

## 2020-08-15 NOTE — Telephone Encounter (Signed)
I called pt. No answer, left a message asking pt to call me back.   

## 2020-08-16 ENCOUNTER — Telehealth: Payer: Self-pay

## 2020-08-16 NOTE — Telephone Encounter (Signed)
Pt is calling back about the results of his sleep study.

## 2020-08-17 NOTE — Telephone Encounter (Signed)
I called pt. No answer, left a message asking pt to call me back.   

## 2020-08-18 NOTE — Telephone Encounter (Signed)
I called pt. I advised pt that Dr. Frances Furbish reviewed their sleep study results and found that pt has osa. Dr. Frances Furbish recommends that pt start autopap for therapy at home. I reviewed PAP compliance expectations with the pt. Pt is agreeable to starting an auto-PAP. I advised pt that an order will be sent to a DME, [facility], and [facility] will call the pt within about one week after they file with the pt's insurance. [facility] will show the pt how to use the machine, fit for masks, and troubleshoot the auto-PAP if needed. Pt will be contacted in two weeks time frame to see about scheduling his f/u. Pt is apprehensive that copayment maybe high and he could not afford to start machine. Pt wanted to wait to scheduled his f/u until getting this information

## 2020-09-01 DIAGNOSIS — E291 Testicular hypofunction: Secondary | ICD-10-CM | POA: Diagnosis not present

## 2020-09-07 ENCOUNTER — Ambulatory Visit: Payer: BC Managed Care – PPO | Admitting: Neurology

## 2020-09-07 ENCOUNTER — Telehealth: Payer: Self-pay

## 2020-09-07 NOTE — Telephone Encounter (Signed)
Received message from [facility] stating pt has decided not to pursue autopap therapy.

## 2020-09-25 ENCOUNTER — Other Ambulatory Visit: Payer: Self-pay | Admitting: Psychiatry

## 2020-09-25 DIAGNOSIS — F410 Panic disorder [episodic paroxysmal anxiety] without agoraphobia: Secondary | ICD-10-CM

## 2020-09-30 ENCOUNTER — Ambulatory Visit (INDEPENDENT_AMBULATORY_CARE_PROVIDER_SITE_OTHER): Payer: BC Managed Care – PPO | Admitting: Psychiatry

## 2020-09-30 ENCOUNTER — Encounter: Payer: Self-pay | Admitting: Psychiatry

## 2020-09-30 ENCOUNTER — Other Ambulatory Visit: Payer: Self-pay

## 2020-09-30 DIAGNOSIS — F410 Panic disorder [episodic paroxysmal anxiety] without agoraphobia: Secondary | ICD-10-CM

## 2020-09-30 MED ORDER — SERTRALINE HCL 100 MG PO TABS: 50.0000 mg | ORAL_TABLET | Freq: Every day | ORAL | 5 refills | Status: DC

## 2020-09-30 MED ORDER — ALPRAZOLAM 1 MG PO TABS: ORAL_TABLET | ORAL | 5 refills | Status: DC

## 2020-09-30 NOTE — Progress Notes (Signed)
Derek [name] [id number] 02-Mar-1961 59 y.o.  Subjective:   Patient ID:  Derek [name] is a 59 y.o. (DOB May 03, 1961) male.  Chief Complaint:  Chief Complaint  Patient presents with  . Panic Attack    HPI Derek [name] presents to the office today for follow-up of anxiety and panic. He reports that he periodically will experience increased anxiety and will take Xanax on a schedule TID for a brief period of time and then anxiety abates. He reports that anxiety does not tend to happen outside of work. He reports that he is not going to concerts and other outings, that he normally would. Denies any other anxiety or anxious thoughts. He reports that he will go for a long ride on his motorcycle when anxiety has increased. He reports that he has not had any recent missed work due to panic. He reports that anxiety is triggered by being around people. "I have a better handle on it, better than I have in the last 2 years." Denies depressed mood. Sleeps well and denies difficulty falling and staying asleep. Appetite has been good. He reports energy and motivation have been good. Concentration is adequate. Denies SI.   Takes a 1/2 Xanax in the morning and may take the other 1/2 if there is significant anxiety. Typically takes 1/2 tab of Xanax 1 mg three times daily or less.   Mother had COVID after getting vaccinated and recovered well from this.    Son is married and is doing well.   Review of Systems:  Review of Systems  Gastrointestinal: Negative.   Musculoskeletal: Negative for gait problem.  Neurological: Negative for tremors and headaches.  Psychiatric/Behavioral:       Please refer to HPI    Had sleep study that indicated very mild sleep apnea.   Medications: I have reviewed the patient's current medications.  Current Outpatient Medications  Medication Sig Dispense Refill  . [START ON 10/31/2020] ALPRAZolam (XANAX) 1 MG tablet TAKE 1 TABLET BY MOUTH TWICE A DAY AS NEEDED FOR ANXIETY 60  tablet 5  . ELIQUIS 5 MG TABS tablet Take 5 mg by mouth 2 (two) times daily.  3  . sertraline (ZOLOFT) 100 MG tablet Take 0.5 tablets (50 mg total) by mouth daily. Take 1/2-1 tab po qd 30 tablet 5   No current facility-administered medications for this visit.    Medication Side Effects: None  Allergies: No Known Allergies  Past Medical History:  Diagnosis Date  . Factor V Leiden (HCC)   . History of DVT (deep vein thrombosis)   . History of panic attacks   . Left nephrolithiasis   . Low testosterone   . Right ureteral stone     Family History  Problem Relation Age of Onset  . Heart disease Paternal Grandmother        ? possible h/o heart attack  . Anxiety disorder Mother   . AAA (abdominal aortic aneurysm) Father   . Heart attack Father 72    Social History   Socioeconomic History  . Marital status: Single    Spouse name: Not on file  . Number of children: Not on file  . Years of education: Not on file  . Highest education level: Not on file  Occupational History  . Not on file  Tobacco Use  . Smoking status: Former Smoker    Packs/day: 0.50    Years: 20.00    Pack years: 10.00    Types: Cigarettes  Quit date: 04/23/2009    Years since quitting: 11.4  . Smokeless tobacco: Current User    Types: Snuff, Chew  . Tobacco comment: occasionally dips / chewtobacco. Quit ~2015.  Substance and Sexual Activity  . Alcohol use: Yes    Alcohol/week: 0.0 standard drinks    Comment: occasional - 1-2 beers/week  . Drug use: No  . Sexual activity: Not on file  Other Topics Concern  . Not on file  Social History Narrative  . Not on file   Social Determinants of Health   Financial Resource Strain:   . Difficulty of Paying Living Expenses: Not on file  Food Insecurity:   . Worried About Programme researcher, broadcasting/film/video in the Last Year: Not on file  . Ran Out of Food in the Last Year: Not on file  Transportation Needs:   . Lack of Transportation (Medical): Not on file  . Lack  of Transportation (Non-Medical): Not on file  Physical Activity:   . Days of Exercise per Week: Not on file  . Minutes of Exercise per Session: Not on file  Stress:   . Feeling of Stress : Not on file  Social Connections:   . Frequency of Communication with Friends and Family: Not on file  . Frequency of Social Gatherings with Friends and Family: Not on file  . Attends Religious Services: Not on file  . Active Member of Clubs or Organizations: Not on file  . Attends Banker Meetings: Not on file  . Marital Status: Not on file  Intimate Partner Violence:   . Fear of Current or Ex-Partner: Not on file  . Emotionally Abused: Not on file  . Physically Abused: Not on file  . Sexually Abused: Not on file    Past Medical History, Surgical history, Social history, and Family history were reviewed and updated as appropriate.   Please see review of systems for further details on the patient's review from today.   Objective:   Physical Exam:  There were no vitals taken for this visit.  Physical Exam Constitutional:      General: He is not in acute distress. Musculoskeletal:        General: No deformity.  Neurological:     Mental Status: He is alert and oriented to person, place, and time.     Coordination: Coordination normal.  Psychiatric:        Attention and Perception: Attention and perception normal. He does not perceive auditory or visual hallucinations.        Mood and Affect: Mood normal. Mood is not anxious or depressed. Affect is not labile, blunt, angry or inappropriate.        Speech: Speech normal.        Behavior: Behavior normal.        Thought Content: Thought content normal. Thought content is not paranoid or delusional. Thought content does not include homicidal or suicidal ideation. Thought content does not include homicidal or suicidal plan.        Cognition and Memory: Cognition and memory normal.        Judgment: Judgment normal.     Comments:  Insight intact     Lab Review:     Component Value Date/Time   NA 138 07/29/2018 1039   K 4.4 07/29/2018 1039   CL 105 07/29/2018 1039   CO2 25 07/29/2018 1039   GLUCOSE 114 (H) 07/29/2018 1039   BUN 16 07/29/2018 1039   CREATININE 1.14 07/29/2018 1039  CALCIUM 8.7 (L) 07/29/2018 1039   GFRNONAA >60 07/29/2018 1039   GFRAA >60 07/29/2018 1039       Component Value Date/Time   WBC 8.7 07/29/2018 1039   RBC 5.15 07/29/2018 1039   HGB 15.3 07/29/2018 1039   HCT 46.6 07/29/2018 1039   PLT 279 07/29/2018 1039   MCV 90.5 07/29/2018 1039   MCV 88.1 11/24/2014 1913   MCH 29.7 07/29/2018 1039   MCHC 32.8 07/29/2018 1039   RDW 12.7 07/29/2018 1039   LYMPHSABS 4.8 (H) 04/22/2014 1455   MONOABS 0.7 04/22/2014 1455   EOSABS 0.4 04/22/2014 1455   BASOSABS 0.0 04/22/2014 1455    No results found for: POCLITH, LITHIUM   No results found for: PHENYTOIN, PHENOBARB, VALPROATE, CBMZ   .res Assessment: Plan:   Continue sertraline 50 mg daily for anxiety. Continue Xanax 1 mg twice daily as needed for anxiety. Patient to follow-up in 6 months or sooner if clinically indicated. Patient advised to contact office with any questions, adverse effects, or acute worsening in signs and symptoms.  Rashon was seen today for panic attack.  Diagnoses and all orders for this visit:  Panic disorder without agoraphobia -     sertraline (ZOLOFT) 100 MG tablet; Take 0.5 tablets (50 mg total) by mouth daily. Take 1/2-1 tab po qd  Panic disorder -     ALPRAZolam (XANAX) 1 MG tablet; TAKE 1 TABLET BY MOUTH TWICE A DAY AS NEEDED FOR ANXIETY     Please see After Visit Summary for patient specific instructions.  Future Appointments  Date Time Provider Department Center  03/30/2021  3:30 PM Corie Chiquito, PMHNP [facility] None    No orders of the defined types were placed in this encounter.   -------------------------------

## 2020-10-03 ENCOUNTER — Other Ambulatory Visit: Payer: Self-pay

## 2020-10-03 DIAGNOSIS — F410 Panic disorder [episodic paroxysmal anxiety] without agoraphobia: Secondary | ICD-10-CM

## 2020-10-03 MED ORDER — SERTRALINE HCL 100 MG PO TABS: ORAL_TABLET | ORAL | 5 refills | Status: DC

## 2020-10-05 DIAGNOSIS — E291 Testicular hypofunction: Secondary | ICD-10-CM | POA: Diagnosis not present

## 2020-10-24 DIAGNOSIS — E786 Lipoprotein deficiency: Secondary | ICD-10-CM | POA: Diagnosis not present

## 2020-10-24 DIAGNOSIS — Z125 Encounter for screening for malignant neoplasm of prostate: Secondary | ICD-10-CM | POA: Diagnosis not present

## 2020-10-24 DIAGNOSIS — Z0000 Encounter for general adult medical examination without abnormal findings: Secondary | ICD-10-CM | POA: Diagnosis not present

## 2020-11-03 DIAGNOSIS — I10 Essential (primary) hypertension: Secondary | ICD-10-CM | POA: Diagnosis not present

## 2020-11-03 DIAGNOSIS — N529 Male erectile dysfunction, unspecified: Secondary | ICD-10-CM | POA: Diagnosis not present

## 2020-11-03 DIAGNOSIS — Z0000 Encounter for general adult medical examination without abnormal findings: Secondary | ICD-10-CM | POA: Diagnosis not present

## 2020-11-06 ENCOUNTER — Other Ambulatory Visit: Payer: Self-pay | Admitting: Psychiatry

## 2020-11-06 DIAGNOSIS — F410 Panic disorder [episodic paroxysmal anxiety] without agoraphobia: Secondary | ICD-10-CM

## 2020-11-07 ENCOUNTER — Ambulatory Visit (INDEPENDENT_AMBULATORY_CARE_PROVIDER_SITE_OTHER): Payer: BC Managed Care – PPO | Admitting: Otolaryngology

## 2020-11-07 ENCOUNTER — Other Ambulatory Visit: Payer: Self-pay

## 2020-11-07 ENCOUNTER — Encounter (INDEPENDENT_AMBULATORY_CARE_PROVIDER_SITE_OTHER): Payer: Self-pay | Admitting: Otolaryngology

## 2020-11-07 VITALS — Temp 98.1°F

## 2020-11-07 DIAGNOSIS — H9041 Sensorineural hearing loss, unilateral, right ear, with unrestricted hearing on the contralateral side: Secondary | ICD-10-CM

## 2020-11-07 DIAGNOSIS — H9120 Sudden idiopathic hearing loss, unspecified ear: Secondary | ICD-10-CM | POA: Diagnosis not present

## 2020-11-07 NOTE — Progress Notes (Signed)
HPI: Derek [name] is a 59 y.o. male who presents for evaluation of right ear complaints. Patient states that he woke up Saturday morning 2 days ago with some dizziness and right ear discomfort and decreased hearing in the right ear. The right ear was sore when he used a Q-tip in the right ear. Denies any drainage from the ear. He had a hearing test for work about a month ago that demonstrated normal hearing in both ears. Since Saturday he does not feel like he can hear well in the right ear.  Past Medical History:  Diagnosis Date  . Factor V Leiden (HCC)   . History of DVT (deep vein thrombosis)   . History of panic attacks   . Left nephrolithiasis   . Low testosterone   . Right ureteral stone    Past Surgical History:  Procedure Laterality Date  . APPENDECTOMY    . CYSTO/  RIGHT RETROGRADE PYELOGRAM/  RIGHT URETERAL STENT PLACEMENT  04-23-2014  . CYSTOSCOPY W/ URETERAL STENT PLACEMENT Right 04/23/2014   Procedure: CYSTOSCOPY  RIGHT RETROGRADE PYELOGRAM/RIGHT URETERAL STENT PLACEMENT;  Surgeon: Magdalene Molly, MD;  Location: [facility];  Service: Urology;  Laterality: Right;  . CYSTOSCOPY WITH RETROGRADE PYELOGRAM, URETEROSCOPY AND STENT PLACEMENT Right 05/03/2014   Procedure: CYSTOSCOPY WITH RETROGRADE PYELOGRAM, URETEROSCOPY AND STENT EXCHANGE;  Surgeon: Magdalene Molly, MD;  Location: [facility];  Service: Urology;  Laterality: Right;  . HOLMIUM LASER APPLICATION Right 05/03/2014   Procedure: HOLMIUM LASER APPLICATION;  Surgeon: Magdalene Molly, MD;  Location: [facility];  Service: Urology;  Laterality: Right;  . ROTATOR CUFF REPAIR     Social History   Socioeconomic History  . Marital status: Single    Spouse name: Not on file  . Number of children: Not on file  . Years of education: Not on file  . Highest education level: Not on file  Occupational History  . Not on file  Tobacco Use  . Smoking status: Former Smoker     Packs/day: 0.50    Years: 20.00    Pack years: 10.00    Types: Cigarettes    Quit date: 04/23/2009    Years since quitting: 11.5  . Smokeless tobacco: Current User    Types: Snuff, Chew  . Tobacco comment: occasionally dips / chewtobacco. Quit ~2015.  Substance and Sexual Activity  . Alcohol use: Yes    Alcohol/week: 0.0 standard drinks    Comment: occasional - 1-2 beers/week  . Drug use: No  . Sexual activity: Not on file  Other Topics Concern  . Not on file  Social History Narrative  . Not on file   Social Determinants of Health   Financial Resource Strain:   . Difficulty of Paying Living Expenses: Not on file  Food Insecurity:   . Worried About Programme researcher, broadcasting/film/video in the Last Year: Not on file  . Ran Out of Food in the Last Year: Not on file  Transportation Needs:   . Lack of Transportation (Medical): Not on file  . Lack of Transportation (Non-Medical): Not on file  Physical Activity:   . Days of Exercise per Week: Not on file  . Minutes of Exercise per Session: Not on file  Stress:   . Feeling of Stress : Not on file  Social Connections:   . Frequency of Communication with Friends and Family: Not on file  . Frequency of Social Gatherings with Friends and Family: Not on  file  . Attends Religious Services: Not on file  . Active Member of Clubs or Organizations: Not on file  . Attends Banker Meetings: Not on file  . Marital Status: Not on file   Family History  Problem Relation Age of Onset  . Heart disease Paternal Grandmother        ? possible h/o heart attack  . Anxiety disorder Mother   . AAA (abdominal aortic aneurysm) Father   . Heart attack Father 41   No Known Allergies Prior to Admission medications   Medication Sig Start Date End Date Taking? Authorizing Provider  ALPRAZolam Prudy Feeler) 1 MG tablet TAKE 1 TABLET BY MOUTH TWICE A DAY AS NEEDED FOR ANXIETY 10/31/20  Yes Corie Chiquito, PMHNP  ELIQUIS 5 MG TABS tablet Take 5 mg by mouth 2 (two)  times daily. 06/24/18  Yes [provider]  sertraline (ZOLOFT) 100 MG tablet Take 1/2-1 tab po qd 10/03/20  Yes Corie Chiquito, PMHNP     Positive ROS: Otherwise negative  All other systems have been reviewed and were otherwise negative with the exception of those mentioned in the HPI and as above.  Physical Exam: Constitutional: Alert, well-appearing, no acute distress Ears: External ears without lesions or tenderness. On microscopic exam both ear canals and TMs are clear. TMs have good mobility on pneumatic otoscopy. On Weber testing this lateralized to the left ear. On testing with a 1024 tuning fork he has essentially normal hearing in the left ear and a moderate hearing loss in the right ear. He had no vertigo or nystagmus. Ear canal was clear with no skin lesions or ulcers noted. Nasal: External nose without lesions. Septum mild deformity.. Clear nasal passages otherwise. Oral: Lips and gums without lesions. Tongue and palate mucosa without lesions. Posterior oropharynx clear. Neck: No palpable adenopathy or masses Respiratory: Breathing comfortably  Skin: No facial/neck lesions or rash noted.  Procedures  Assessment: Right ear sudden sensorineural hearing loss  Plan: We were unable to obtain audiogram in the office today. Placed him on steroids prednisone 60 mg x 5 days followed by 40 mg x 4 days and 20 mg x 4 days.  He will follow-up.  10 to 14 days for audiologic testing.  Also instructed him to bring the previous hearing test he had performed at work with him to review this.  Narda Bonds, MD

## 2020-11-10 ENCOUNTER — Telehealth (INDEPENDENT_AMBULATORY_CARE_PROVIDER_SITE_OTHER): Payer: Self-pay

## 2020-11-22 ENCOUNTER — Ambulatory Visit (INDEPENDENT_AMBULATORY_CARE_PROVIDER_SITE_OTHER): Payer: BC Managed Care – PPO | Admitting: Otolaryngology

## 2020-11-22 ENCOUNTER — Encounter (INDEPENDENT_AMBULATORY_CARE_PROVIDER_SITE_OTHER): Payer: Self-pay | Admitting: Otolaryngology

## 2020-11-22 ENCOUNTER — Other Ambulatory Visit: Payer: Self-pay

## 2020-11-22 VITALS — Temp 97.3°F

## 2020-11-22 DIAGNOSIS — H9041 Sensorineural hearing loss, unilateral, right ear, with unrestricted hearing on the contralateral side: Secondary | ICD-10-CM

## 2020-11-22 DIAGNOSIS — H903 Sensorineural hearing loss, bilateral: Secondary | ICD-10-CM | POA: Diagnosis not present

## 2020-11-22 NOTE — Progress Notes (Signed)
HPI: Derek [name] is a 59 y.o. male who returns today for evaluation of sudden right ear sensorineural hearing loss that occurred little over 2 weeks ago.  He has completed his steroid Dosepak.  He returns today for repeat audiologic testing.  On repeat audiologic testing he does have slightly worse hearing in the right ear compared to the left with SRT on the right side of 25 DB and SRT on the left side at 15 dB.  He has not noticed any change in his hearing..  Past Medical History:  Diagnosis Date  . Factor V Leiden (HCC)   . History of DVT (deep vein thrombosis)   . History of panic attacks   . Left nephrolithiasis   . Low testosterone   . Right ureteral stone    Past Surgical History:  Procedure Laterality Date  . APPENDECTOMY    . CYSTO/  RIGHT RETROGRADE PYELOGRAM/  RIGHT URETERAL STENT PLACEMENT  04-23-2014  . CYSTOSCOPY W/ URETERAL STENT PLACEMENT Right 04/23/2014   Procedure: CYSTOSCOPY  RIGHT RETROGRADE PYELOGRAM/RIGHT URETERAL STENT PLACEMENT;  Surgeon: Magdalene Molly, MD;  Location: [facility];  Service: Urology;  Laterality: Right;  . CYSTOSCOPY WITH RETROGRADE PYELOGRAM, URETEROSCOPY AND STENT PLACEMENT Right 05/03/2014   Procedure: CYSTOSCOPY WITH RETROGRADE PYELOGRAM, URETEROSCOPY AND STENT EXCHANGE;  Surgeon: Magdalene Molly, MD;  Location: [facility];  Service: Urology;  Laterality: Right;  . HOLMIUM LASER APPLICATION Right 05/03/2014   Procedure: HOLMIUM LASER APPLICATION;  Surgeon: Magdalene Molly, MD;  Location: [facility];  Service: Urology;  Laterality: Right;  . ROTATOR CUFF REPAIR     Social History   Socioeconomic History  . Marital status: Single    Spouse name: Not on file  . Number of children: Not on file  . Years of education: Not on file  . Highest education level: Not on file  Occupational History  . Not on file  Tobacco Use  . Smoking status: Former Smoker    Packs/day: 0.50    Years: 20.00     Pack years: 10.00    Types: Cigarettes    Quit date: 04/23/2009    Years since quitting: 11.5  . Smokeless tobacco: Current User    Types: Snuff, Chew  . Tobacco comment: occasionally dips / chewtobacco. Quit ~2015.  Substance and Sexual Activity  . Alcohol use: Yes    Alcohol/week: 0.0 standard drinks    Comment: occasional - 1-2 beers/week  . Drug use: No  . Sexual activity: Not on file  Other Topics Concern  . Not on file  Social History Narrative  . Not on file   Social Determinants of Health   Financial Resource Strain: Not on file  Food Insecurity: Not on file  Transportation Needs: Not on file  Physical Activity: Not on file  Stress: Not on file  Social Connections: Not on file   Family History  Problem Relation Age of Onset  . Heart disease Paternal Grandmother        ? possible h/o heart attack  . Anxiety disorder Mother   . AAA (abdominal aortic aneurysm) Father   . Heart attack Father 70   No Known Allergies Prior to Admission medications   Medication Sig Start Date End Date Taking? Authorizing Provider  ALPRAZolam Prudy Feeler) 1 MG tablet TAKE 1 TABLET BY MOUTH TWICE A DAY AS NEEDED FOR ANXIETY 10/31/20   Corie Chiquito, PMHNP  ELIQUIS 5 MG TABS tablet Take 5 mg by  mouth 2 (two) times daily. 06/24/18   [provider]  sertraline (ZOLOFT) 100 MG tablet TAKE 1/2 OR 1 TABLET BY MOUTH EVERY DAY 11/09/20   Corie Chiquito, PMHNP     Positive ROS: Otherwise negative  All other systems have been reviewed and were otherwise negative with the exception of those mentioned in the HPI and as above.  Physical Exam: Constitutional: Alert, well-appearing, no acute distress Ears: External ears without lesions or tenderness. Ear canals are clear bilaterally.  TMs are clear bilaterally TMs have good mobility on pneumatic otoscopy and no middle ear fluid noted behind the right ear.  AC > BC on the right side. Nasal: External nose without lesions. Septum slight deformity  to the right with mild rhinitis.  Clear nasal passages otherwise.. Clear nasal passages Oral: Lips and gums without lesions. Tongue and palate mucosa without lesions. Posterior oropharynx clear. Neck: No palpable adenopathy or masses Respiratory: Breathing comfortably  Skin: No facial/neck lesions or rash noted.  Procedures  Assessment: Asymmetric sensorineural hearing loss with apparent sudden right ear hearing loss little over 2 weeks ago.  Plan: We will plan on scheduling an MRI scan to rule out cochlear or retrocochlear pathology as source of hearing loss. He will call us back after the MRI scan concerning results. Discussed with him that the treatment option for this if the MRI scan is negative would be to obtain hearing [id number].   Narda Bonds, MD

## 2020-11-23 ENCOUNTER — Other Ambulatory Visit (INDEPENDENT_AMBULATORY_CARE_PROVIDER_SITE_OTHER): Payer: Self-pay

## 2020-11-23 DIAGNOSIS — H9041 Sensorineural hearing loss, unilateral, right ear, with unrestricted hearing on the contralateral side: Secondary | ICD-10-CM

## 2020-11-24 ENCOUNTER — Ambulatory Visit
Admission: RE | Admit: 2020-11-24 | Discharge: 2020-11-24 | Disposition: A | Discharge status: Home / Self Care | Payer: BC Managed Care – PPO | Source: Ambulatory Visit | Attending: Otolaryngology | Admitting: Otolaryngology

## 2020-11-24 DIAGNOSIS — H9190 Unspecified hearing loss, unspecified ear: Secondary | ICD-10-CM | POA: Diagnosis not present

## 2020-11-24 DIAGNOSIS — H9041 Sensorineural hearing loss, unilateral, right ear, with unrestricted hearing on the contralateral side: Secondary | ICD-10-CM

## 2020-11-24 MED ORDER — GADOBENATE DIMEGLUMINE 529 MG/ML IV SOLN
20.0000 mL | Freq: Once | INTRAVENOUS | Status: AC | PRN
  Administered 2020-11-24: 20 mL via INTRAVENOUS

## 2020-12-08 ENCOUNTER — Encounter (INDEPENDENT_AMBULATORY_CARE_PROVIDER_SITE_OTHER): Payer: Self-pay

## 2020-12-08 DIAGNOSIS — R0981 Nasal congestion: Secondary | ICD-10-CM | POA: Diagnosis not present

## 2020-12-08 DIAGNOSIS — J029 Acute pharyngitis, unspecified: Secondary | ICD-10-CM | POA: Diagnosis not present

## 2020-12-08 DIAGNOSIS — J069 Acute upper respiratory infection, unspecified: Secondary | ICD-10-CM | POA: Diagnosis not present

## 2020-12-20 ENCOUNTER — Telehealth (INDEPENDENT_AMBULATORY_CARE_PROVIDER_SITE_OTHER): Payer: Self-pay | Admitting: Otolaryngology

## 2020-12-20 NOTE — Telephone Encounter (Signed)
Called patient concerning results of his MRI scan.  This showed no cochlear or retrocochlear pathology to explain patient's hearing loss. 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 Cautioned him about using ear protection when around any loud noise as this may make the hearing loss worse.  The only treatment options for the hearing loss would be hearing aids and briefly discussed this with him.

## 2020-12-21 ENCOUNTER — Other Ambulatory Visit: Payer: BC Managed Care – PPO

## 2020-12-21 DIAGNOSIS — E291 Testicular hypofunction: Secondary | ICD-10-CM | POA: Diagnosis not present

## 2021-02-20 DIAGNOSIS — L249 Irritant contact dermatitis, unspecified cause: Secondary | ICD-10-CM | POA: Diagnosis not present

## 2021-03-08 DIAGNOSIS — E291 Testicular hypofunction: Secondary | ICD-10-CM | POA: Diagnosis not present

## 2021-03-10 DIAGNOSIS — L503 Dermatographic urticaria: Secondary | ICD-10-CM | POA: Diagnosis not present

## 2021-03-10 DIAGNOSIS — L249 Irritant contact dermatitis, unspecified cause: Secondary | ICD-10-CM | POA: Diagnosis not present

## 2021-03-30 ENCOUNTER — Ambulatory Visit (INDEPENDENT_AMBULATORY_CARE_PROVIDER_SITE_OTHER): Payer: BC Managed Care – PPO | Admitting: Psychiatry

## 2021-03-30 ENCOUNTER — Other Ambulatory Visit: Payer: Self-pay

## 2021-03-30 ENCOUNTER — Encounter: Payer: Self-pay | Admitting: Psychiatry

## 2021-03-30 DIAGNOSIS — F410 Panic disorder [episodic paroxysmal anxiety] without agoraphobia: Secondary | ICD-10-CM | POA: Diagnosis not present

## 2021-03-30 MED ORDER — ALPRAZOLAM 1 MG PO TABS: ORAL_TABLET | ORAL | 5 refills | Status: DC

## 2021-03-30 MED ORDER — SERTRALINE HCL 100 MG PO TABS: ORAL_TABLET | ORAL | 2 refills | Status: DC

## 2021-03-30 NOTE — Progress Notes (Incomplete)
Derek [name] [id number] [birth year] 60 y.o.  Subjective:   Patient ID:  Derek [name] is a 60 y.o. (DOB Dec 18, 1960) male.  Chief Complaint:  Chief Complaint  Patient presents with  . Follow-up    Panic    HPI Derek [name] presents to the office today for follow-up of anxiety and panic. He reports, "for the most part I have done pretty well." He reports that he noticed some increased anxiety around the time change for about 2 weeks. He reports that anxiety has been manageable overall. He reports that he is able to manage anxiety and panic with Xanax or getting up and walking around. Denies excessive worry. Denies depressed mood. Sleep has been ok. He reports that he has had some early morning awakenings, around 3 am. He typically goes to bed around 9 pm. Appetite has been good. Energy and motivation have been good. Denies SI.   He typically takes 1/2 tab of Xanax upon awakening and 1/2 tab before going into work. He typically does not need additional Xanax and occasionally will take a 1/2 tablet if anxiety occurs later in the day.   Has been with current employer for 33 years.    Close with son. Talks with son every other day.   Review of Systems:  Review of Systems  Gastrointestinal: Negative.   Musculoskeletal: Negative for gait problem.  Neurological: Negative for tremors.  Psychiatric/Behavioral:       Please refer to HPI    Medications: I have reviewed the patient's current medications.  Current Outpatient Medications  Medication Sig Dispense Refill  . ELIQUIS 5 MG TABS tablet Take 5 mg by mouth 2 (two) times daily.  3  . sertraline (ZOLOFT) 100 MG tablet TAKE 1/2 OR 1 TABLET BY MOUTH EVERY DAY (Patient taking differently: Take 50 mg by mouth daily. TAKE 1/2 OR 1 TABLET BY MOUTH EVERY DAY) 90 tablet 2  . ALPRAZolam (XANAX) 1 MG tablet TAKE 1 TABLET BY MOUTH TWICE A DAY AS NEEDED FOR ANXIETY 60 tablet 5   No current facility-administered medications for this visit.     Medication Side Effects: None  Allergies: No Known Allergies  Past Medical History:  Diagnosis Date  . Factor V Leiden (HCC)   . History of DVT (deep vein thrombosis)   . History of panic attacks   . Left nephrolithiasis   . Low testosterone   . Right ureteral stone     Past Medical History, Surgical history, Social history, and Family history were reviewed and updated as appropriate.   Please see review of systems for further details on the patient's review from today.   Objective:   Physical Exam:  There were no vitals taken for this visit.  Physical Exam  Lab Review:     Component Value Date/Time   NA 138 07/29/2018 1039   K 4.4 07/29/2018 1039   CL 105 07/29/2018 1039   CO2 25 07/29/2018 1039   GLUCOSE 114 (H) 07/29/2018 1039   BUN 16 07/29/2018 1039   CREATININE 1.14 07/29/2018 1039   CALCIUM 8.7 (L) 07/29/2018 1039   GFRNONAA >60 07/29/2018 1039   GFRAA >60 07/29/2018 1039       Component Value Date/Time   WBC 8.7 07/29/2018 1039   RBC 5.15 07/29/2018 1039   HGB 15.3 07/29/2018 1039   HCT 46.6 07/29/2018 1039   PLT 279 07/29/2018 1039   MCV 90.5 07/29/2018 1039   MCV 88.1 11/24/2014 1913   MCH 29.7 07/29/2018  1039   MCHC 32.8 07/29/2018 1039   RDW 12.7 07/29/2018 1039   LYMPHSABS 4.8 (H) 04/22/2014 1455   MONOABS 0.7 04/22/2014 1455   EOSABS 0.4 04/22/2014 1455   BASOSABS 0.0 04/22/2014 1455    No results found for: POCLITH, LITHIUM   No results found for: PHENYTOIN, PHENOBARB, VALPROATE, CBMZ   .res Assessment: Plan:    There are no diagnoses linked to this encounter.   Please see After Visit Summary for patient specific instructions.  No future appointments.  No orders of the defined types were placed in this encounter.   -------------------------------

## 2021-03-31 NOTE — Progress Notes (Signed)
Derek [name] [id number] 03/11/1961 60 y.o.  Subjective:   Patient ID:  Derek [name] is a 60 y.o. (DOB Jan 06, 1961) male.  Chief Complaint:  Chief Complaint  Patient presents with  . Follow-up    Panic    HPI Derek [name] presents to the office today for follow-up of anxiety and panic. He reports, "for the most part I have done pretty well." He reports that he noticed some increased anxiety around the time change for about 2 weeks. He reports that anxiety has been manageable overall. He reports that he is able to manage anxiety and panic with Xanax or getting up and walking around. Denies excessive worry. Denies depressed mood. Sleep has been ok. He reports that he has had some early morning awakenings, around 3 am. He typically goes to bed around 9 pm. Appetite has been good. Energy and motivation have been good. Denies SI.   He typically takes 1/2 tab of Xanax upon awakening and 1/2 tab before going into work. He typically does not need additional Xanax and occasionally will take a 1/2 tablet if anxiety occurs later in the day.   Has been with current employer for 33 years.    Close with son. Talks with son every other day.     Review of Systems:  Review of Systems  Musculoskeletal: Negative for gait problem.  Neurological: Negative for tremors.  Psychiatric/Behavioral:       Please refer to HPI    Medications: I have reviewed the patient's current medications.  Current Outpatient Medications  Medication Sig Dispense Refill  . ELIQUIS 5 MG TABS tablet Take 5 mg by mouth 2 (two) times daily.  3  . [START ON 04/27/2021] ALPRAZolam (XANAX) 1 MG tablet TAKE 1 TABLET BY MOUTH TWICE A DAY AS NEEDED FOR ANXIETY 60 tablet 5  . sertraline (ZOLOFT) 100 MG tablet TAKE 1/2 OR 1 TABLET BY MOUTH EVERY DAY 90 tablet 2   No current facility-administered medications for this visit.    Medication Side Effects: None  Allergies: No Known Allergies  Past Medical History:  Diagnosis Date   . Factor V Leiden (HCC)   . History of DVT (deep vein thrombosis)   . History of panic attacks   . Left nephrolithiasis   . Low testosterone   . Right ureteral stone     Past Medical History, Surgical history, Social history, and Family history were reviewed and updated as appropriate.   Please see review of systems for further details on the patient's review from today.   Objective:   Physical Exam:  There were no vitals taken for this visit.  Physical Exam Constitutional:      General: He is not in acute distress. Musculoskeletal:        General: No deformity.  Neurological:     Mental Status: He is alert and oriented to person, place, and time.     Coordination: Coordination normal.  Psychiatric:        Attention and Perception: Attention and perception normal. He does not perceive auditory or visual hallucinations.        Mood and Affect: Mood normal. Mood is not anxious or depressed. Affect is not labile, blunt, angry or inappropriate.        Speech: Speech normal.        Behavior: Behavior normal.        Thought Content: Thought content normal. Thought content is not paranoid or delusional. Thought content does not include homicidal or  suicidal ideation. Thought content does not include homicidal or suicidal plan.        Cognition and Memory: Cognition and memory normal.        Judgment: Judgment normal.     Comments: Insight intact     Lab Review:     Component Value Date/Time   NA 138 07/29/2018 1039   K 4.4 07/29/2018 1039   CL 105 07/29/2018 1039   CO2 25 07/29/2018 1039   GLUCOSE 114 (H) 07/29/2018 1039   BUN 16 07/29/2018 1039   CREATININE 1.14 07/29/2018 1039   CALCIUM 8.7 (L) 07/29/2018 1039   GFRNONAA >60 07/29/2018 1039   GFRAA >60 07/29/2018 1039       Component Value Date/Time   WBC 8.7 07/29/2018 1039   RBC 5.15 07/29/2018 1039   HGB 15.3 07/29/2018 1039   HCT 46.6 07/29/2018 1039   PLT 279 07/29/2018 1039   MCV 90.5 07/29/2018 1039    MCV 88.1 11/24/2014 1913   MCH 29.7 07/29/2018 1039   MCHC 32.8 07/29/2018 1039   RDW 12.7 07/29/2018 1039   LYMPHSABS 4.8 (H) 04/22/2014 1455   MONOABS 0.7 04/22/2014 1455   EOSABS 0.4 04/22/2014 1455   BASOSABS 0.0 04/22/2014 1455    No results found for: POCLITH, LITHIUM   No results found for: PHENYTOIN, PHENOBARB, VALPROATE, CBMZ   .res Assessment: Plan:   Patient seen for 30 minutes and time spent discussing panic.  Discussed nonpharmacological strategies to help manage anxiety and prevent panic.  Encouraged patient to continue using strategies of taking a walk and leaving the situation when panic arises.  Patient reports that breathing exercises were ineffective in the past when he used them when experiencing acute panic signs and symptoms.  Encouraged patient to consider trying breathing exercises when he notices slight anxiety before the point of panic. Will continue sertraline 100 mg 1/2 to 1 tablet daily for anxiety. Continue Xanax 1 mg twice daily as needed for anxiety. Patient to follow-up in 6 months or sooner if clinically indicated. Patient advised to contact office with any questions, adverse effects, or acute worsening in signs and symptoms.  [place] was seen today for follow-up.  Diagnoses and all orders for this visit:  Panic disorder without agoraphobia -     sertraline (ZOLOFT) 100 MG tablet; TAKE 1/2 OR 1 TABLET BY MOUTH EVERY DAY  Panic disorder -     ALPRAZolam (XANAX) 1 MG tablet; TAKE 1 TABLET BY MOUTH TWICE A DAY AS NEEDED FOR ANXIETY     Please see After Visit Summary for patient specific instructions.  Future Appointments  Date Time Provider Department Center  12/07/2021  3:30 PM Corie Chiquito, PMHNP [facility] None    No orders of the defined types were placed in this encounter.   -------------------------------

## 2021-04-04 DIAGNOSIS — L308 Other specified dermatitis: Secondary | ICD-10-CM | POA: Diagnosis not present

## 2021-05-18 DIAGNOSIS — E291 Testicular hypofunction: Secondary | ICD-10-CM | POA: Diagnosis not present

## 2021-06-21 DIAGNOSIS — E291 Testicular hypofunction: Secondary | ICD-10-CM | POA: Diagnosis not present

## 2021-08-17 DIAGNOSIS — E291 Testicular hypofunction: Secondary | ICD-10-CM | POA: Diagnosis not present

## 2021-09-07 DIAGNOSIS — E786 Lipoprotein deficiency: Secondary | ICD-10-CM | POA: Diagnosis not present

## 2021-09-07 DIAGNOSIS — Z125 Encounter for screening for malignant neoplasm of prostate: Secondary | ICD-10-CM | POA: Diagnosis not present

## 2021-09-07 DIAGNOSIS — E291 Testicular hypofunction: Secondary | ICD-10-CM | POA: Diagnosis not present

## 2021-09-21 DIAGNOSIS — G4733 Obstructive sleep apnea (adult) (pediatric): Secondary | ICD-10-CM | POA: Diagnosis not present

## 2021-09-21 DIAGNOSIS — Z0000 Encounter for general adult medical examination without abnormal findings: Secondary | ICD-10-CM | POA: Diagnosis not present

## 2021-09-26 DIAGNOSIS — E291 Testicular hypofunction: Secondary | ICD-10-CM | POA: Diagnosis not present

## 2021-10-03 DIAGNOSIS — B358 Other dermatophytoses: Secondary | ICD-10-CM | POA: Diagnosis not present

## 2021-10-07 ENCOUNTER — Other Ambulatory Visit: Payer: Self-pay | Admitting: Psychiatry

## 2021-10-07 DIAGNOSIS — F410 Panic disorder [episodic paroxysmal anxiety] without agoraphobia: Secondary | ICD-10-CM

## 2021-12-07 ENCOUNTER — Ambulatory Visit: Payer: BC Managed Care – PPO | Admitting: Psychiatry

## 2021-12-19 DIAGNOSIS — R0981 Nasal congestion: Secondary | ICD-10-CM | POA: Diagnosis not present

## 2021-12-19 DIAGNOSIS — U071 COVID-19: Secondary | ICD-10-CM | POA: Diagnosis not present

## 2022-01-03 ENCOUNTER — Telehealth: Payer: Self-pay | Admitting: Psychiatry

## 2022-01-03 DIAGNOSIS — F410 Panic disorder [episodic paroxysmal anxiety] without agoraphobia: Secondary | ICD-10-CM

## 2022-01-03 MED ORDER — SERTRALINE HCL 100 MG PO TABS: ORAL_TABLET | ORAL | 0 refills | Status: DC

## 2022-01-03 NOTE — Telephone Encounter (Signed)
Ok to send

## 2022-01-03 NOTE — Telephone Encounter (Signed)
Pt LVM stating that his script for Sertraline was denied.  He has an appt on 2/7 and he wants to know if the med can be filled until his appt.

## 2022-01-03 NOTE — Telephone Encounter (Signed)
Please let him know a refill has been sent.

## 2022-01-09 ENCOUNTER — Ambulatory Visit: Payer: BC Managed Care – PPO | Admitting: Psychiatry

## 2022-01-16 ENCOUNTER — Other Ambulatory Visit: Payer: Self-pay | Admitting: Psychiatry

## 2022-01-16 DIAGNOSIS — F410 Panic disorder [episodic paroxysmal anxiety] without agoraphobia: Secondary | ICD-10-CM

## 2022-01-18 NOTE — Telephone Encounter (Signed)
Last filled 1/7 appt on 3/14

## 2022-02-13 ENCOUNTER — Encounter: Payer: Self-pay | Admitting: Psychiatry

## 2022-02-13 ENCOUNTER — Ambulatory Visit (INDEPENDENT_AMBULATORY_CARE_PROVIDER_SITE_OTHER): Payer: BC Managed Care – PPO | Admitting: Psychiatry

## 2022-02-13 ENCOUNTER — Other Ambulatory Visit: Payer: Self-pay

## 2022-02-13 DIAGNOSIS — F410 Panic disorder [episodic paroxysmal anxiety] without agoraphobia: Secondary | ICD-10-CM

## 2022-02-13 MED ORDER — ALPRAZOLAM 1 MG PO TABS: ORAL_TABLET | ORAL | 5 refills | Status: DC

## 2022-02-13 MED ORDER — SERTRALINE HCL 100 MG PO TABS: ORAL_TABLET | ORAL | 1 refills | Status: DC

## 2022-02-13 NOTE — Progress Notes (Signed)
Derek [name] ?[id number] ?October 12, 1961 ?61 y.o. ? ?Subjective:  ? ?Patient ID:  Derek [name] is a 61 y.o. (DOB 1961-03-22) male. ? ?Chief Complaint:  ?Chief Complaint  ?Patient presents with  ? Follow-up  ?  Panic   ? ? ?HPI ?Derek [name] presents to the office today for follow-up of panic. He reports medications have been effective. He has not had to miss work or leave early. He reports that he has had to take breaks at times due to anxiety with increased HR and shortness of breath. He is taking Xanax 1 mg 1/2 tab in the morning, noon, and evening. He reports that he occasionally needs an additional 1/2 tab. He reports that he has anxiety most often occurs at work. He reports that he may have 2-3 weeks without any anxiety and then may have 1-2 "bad weeks" with increased anxiety/ panic. He reports that he was having daily panic attacks. "For the most part, I'm doing really well." He denies any excessive worry. Denies depressed mood. Sleeping well. Appetite has been good. Energy and motivation have been ok overall. Concentration has been good. Denies SI.  ? ?His mother has cancer and thinks they caught it early. He has been going with her to her appointments.  ? ?He has been taking Sertraline 100 mg 1/2 tablet and had side effects with one tablet.  ?  ? ?Review of Systems:  ?Review of Systems  ?Gastrointestinal: Negative.   ?Musculoskeletal:  Negative for gait problem.  ?Neurological:  Negative for tremors and headaches.  ?Psychiatric/Behavioral:    ?     Please refer to HPI  ? ?Had COVID again in January. He reports that he had more severe symptoms then.  ? ?Medications: I have reviewed the patient's current medications. ? ?Current Outpatient Medications  ?Medication Sig Dispense Refill  ? ELIQUIS 5 MG TABS tablet Take 5 mg by mouth 2 (two) times daily.  3  ? [START ON 03/13/2022] ALPRAZolam (XANAX) 1 MG tablet TAKE 1 TABLET BY MOUTH TWICE A DAY AS NEEDED FOR ANXIETY 60 tablet 5  ? sertraline (ZOLOFT) 100 MG tablet TAKE  1/2 OR 1 TABLET BY MOUTH EVERY DAY 90 tablet 1  ? ?No current facility-administered medications for this visit.  ? ? ?Medication Side Effects: None ? ?Allergies: No Known Allergies ? ?Past Medical History:  ?Diagnosis Date  ? Factor V Leiden (HCC)   ? History of DVT (deep vein thrombosis)   ? History of panic attacks   ? Left nephrolithiasis   ? Low testosterone   ? Right ureteral stone   ? ? ?Past Medical History, Surgical history, Social history, and Family history were reviewed and updated as appropriate.  ? ?Please see review of systems for further details on the patient's review from today.  ? ?Objective:  ? ?Physical Exam:  ?There were no vitals taken for this visit. ? ?Physical Exam ?Constitutional:   ?   General: He is not in acute distress. ?Musculoskeletal:     ?   General: No deformity.  ?Neurological:  ?   Mental Status: He is alert and oriented to person, place, and time.  ?   Coordination: Coordination normal.  ?Psychiatric:     ?   Attention and Perception: Attention and perception normal. He does not perceive auditory or visual hallucinations.     ?   Mood and Affect: Mood normal. Mood is not anxious or depressed. Affect is not labile, blunt, angry or inappropriate.     ?  Speech: Speech normal.     ?   Behavior: Behavior normal.     ?   Thought Content: Thought content normal. Thought content is not paranoid or delusional. Thought content does not include homicidal or suicidal ideation. Thought content does not include homicidal or suicidal plan.     ?   Cognition and Memory: Cognition and memory normal.     ?   Judgment: Judgment normal.  ?   Comments: Insight intact  ? ? ?Lab Review:  ?   ?Component Value Date/Time  ? NA 138 07/29/2018 1039  ? K 4.4 07/29/2018 1039  ? CL 105 07/29/2018 1039  ? CO2 25 07/29/2018 1039  ? GLUCOSE 114 (H) 07/29/2018 1039  ? BUN 16 07/29/2018 1039  ? CREATININE 1.14 07/29/2018 1039  ? CALCIUM 8.7 (L) 07/29/2018 1039  ? GFRNONAA >60 07/29/2018 1039  ? GFRAA >60  07/29/2018 1039  ? ? ?   ?Component Value Date/Time  ? WBC 8.7 07/29/2018 1039  ? RBC 5.15 07/29/2018 1039  ? HGB 15.3 07/29/2018 1039  ? HCT 46.6 07/29/2018 1039  ? PLT 279 07/29/2018 1039  ? MCV 90.5 07/29/2018 1039  ? MCV 88.1 11/24/2014 1913  ? MCH 29.7 07/29/2018 1039  ? MCHC 32.8 07/29/2018 1039  ? RDW 12.7 07/29/2018 1039  ? LYMPHSABS 4.8 (H) 04/22/2014 1455  ? MONOABS 0.7 04/22/2014 1455  ? EOSABS 0.4 04/22/2014 1455  ? BASOSABS 0.0 04/22/2014 1455  ? ? ?No results found for: POCLITH, LITHIUM  ? ?No results found for: PHENYTOIN, PHENOBARB, VALPROATE, CBMZ  ? ?.res ?Assessment: Plan:   ?Will continue current plan of care since target signs and symptoms are well controlled without any tolerability issues. ?Will continue Sertraline 100 mg 1/2-1 tab po qd for anxiety.  ?Will continue Xanax 1 mg po BID prn anxiety.  ?Pt to follow-up in January 2024 or sooner if clinically indicated.  ?Patient advised to contact office with any questions, adverse effects, or acute worsening in signs and symptoms. ? ?[name] was seen today for follow-up. ? ?Diagnoses and all orders for this visit: ? ?Panic disorder ?-     ALPRAZolam (XANAX) 1 MG tablet; TAKE 1 TABLET BY MOUTH TWICE A DAY AS NEEDED FOR ANXIETY ?-     sertraline (ZOLOFT) 100 MG tablet; TAKE 1/2 OR 1 TABLET BY MOUTH EVERY DAY ? ?  ? ?Please see After Visit Summary for patient specific instructions. ? ?No future appointments. ? ?No orders of the defined types were placed in this encounter. ? ? ?------------------------------- ?

## 2022-07-02 ENCOUNTER — Telehealth: Payer: Self-pay | Admitting: Psychiatry

## 2022-07-02 NOTE — Telephone Encounter (Signed)
Please let him know that he can increase Alprazolam to a total of 3 tabs daily as needed over the next 3-5 days to help get acute anxiety under control. We can send in script if needed to cover increase. Agree with trying higher dose of Sertraline for now to possibly help with anxiety. We can provide documentation for work if needed to excuse absences. Ok to offer him a work-in apt if needed. Please advise him to call back if anxiety worsens or does not improve.

## 2022-07-02 NOTE — Telephone Encounter (Signed)
See message. Called patient and he said the last 4 days he has struggled with anxiety. He said it just won't let go. He says he feels like he is going to hyperventilate. It is worse at work, but he is having at home also. He has upped sertraline to 100 mg the last couple of days. He said he did this once before and he said it makes him lazy, could not get more detailed info. He is sleeping too much. He said he takes 0.5 mg alprazolam when he gets up and takes another 0.5 mg once he gets to work and usually does okay. He said the last few days he has taken another whole tablet in divided doses and has probably taken at least another 0.5. He said he doesn't like being around people at work. In the past he said he has had intensive cardiac workup without any findings. He said he can take more days off, but doesn't know if that will make his anxiety better.    Pharmacy is [facility] on [address].

## 2022-07-02 NOTE — Telephone Encounter (Signed)
Patient called 2:57 and said he has had trouble with working. He is struggling. He was not at work whole days Thursday and Friday last week and took off today. Not sure if he should try to take time off. He is taking Zoloft and Xanax as prescribed.  Seems like since he took the shingles vaccine he hasn't felt good. Felt flu-like. Has got social anxiety too that is flared up.

## 2022-07-02 NOTE — Telephone Encounter (Signed)
See message. Called patient and he said the last 4 days he has struggled with anxiety. He said it just won't let go. He says he feels like he is going to hyperventilate. It is worse at work, but he is having at home also. He has upped sertraline to 100 mg the last couple of days. He said he did this once before and he said it makes him lazy, could not get more detailed info. He is sleeping too much. He said he takes 0.5 mg alprazolam when he gets up and takes another 0.5 mg once he gets to work and usually does okay. He said the last few days he has taken another whole tablet in divided doses and has probably taken at least another 0.5. He said he doesn't like being around people at work. In the past he said he has had intensive cardiac workup without any findings. He said he can take more days off, but doesn't know if that will make his anxiety better.    Pharmacy is [facility] on [address].  

## 2022-07-03 NOTE — Telephone Encounter (Signed)
Called patient to notify him of your recommendations. He said he has felt better today than he has the past 5 days. He doesn't know that he needs a work note but will let us know. As of our conversation this morning he didn't feel like he needed a work-in visit. He said he would call if he felt like he needed to be seen. He said he doesn't like to take the alprazolam because it makes him sleepy, so at this time he doesn't need a new Rx.

## 2022-07-03 NOTE — Telephone Encounter (Signed)
Noted  

## 2022-09-01 ENCOUNTER — Other Ambulatory Visit: Payer: Self-pay | Admitting: Psychiatry

## 2022-09-01 DIAGNOSIS — F410 Panic disorder [episodic paroxysmal anxiety] without agoraphobia: Secondary | ICD-10-CM

## 2022-09-03 NOTE — Telephone Encounter (Signed)
Filled 8/31 appt next year

## 2022-10-09 DIAGNOSIS — E291 Testicular hypofunction: Secondary | ICD-10-CM | POA: Diagnosis not present

## 2022-10-09 DIAGNOSIS — I10 Essential (primary) hypertension: Secondary | ICD-10-CM | POA: Diagnosis not present

## 2022-10-09 DIAGNOSIS — Z125 Encounter for screening for malignant neoplasm of prostate: Secondary | ICD-10-CM | POA: Diagnosis not present

## 2022-10-09 DIAGNOSIS — Z1212 Encounter for screening for malignant neoplasm of rectum: Secondary | ICD-10-CM | POA: Diagnosis not present

## 2022-10-16 DIAGNOSIS — I10 Essential (primary) hypertension: Secondary | ICD-10-CM | POA: Diagnosis not present

## 2022-10-16 DIAGNOSIS — R002 Palpitations: Secondary | ICD-10-CM | POA: Diagnosis not present

## 2022-10-16 DIAGNOSIS — Z0000 Encounter for general adult medical examination without abnormal findings: Secondary | ICD-10-CM | POA: Diagnosis not present

## 2022-11-03 ENCOUNTER — Other Ambulatory Visit: Payer: Self-pay

## 2022-11-03 ENCOUNTER — Encounter (HOSPITAL_COMMUNITY): Payer: Self-pay | Admitting: Emergency Medicine

## 2022-11-03 ENCOUNTER — Ambulatory Visit (HOSPITAL_COMMUNITY)
Admission: EM | Admit: 2022-11-03 | Discharge: 2022-11-03 | Disposition: A | Discharge status: Home / Self Care | Payer: BC Managed Care – PPO | Attending: Emergency Medicine | Admitting: Emergency Medicine

## 2022-11-03 DIAGNOSIS — K529 Noninfective gastroenteritis and colitis, unspecified: Secondary | ICD-10-CM

## 2022-11-03 DIAGNOSIS — R197 Diarrhea, unspecified: Secondary | ICD-10-CM

## 2022-11-03 NOTE — ED Triage Notes (Signed)
Pt sts diarrhea x 4 days with 10 episodes a day

## 2022-11-03 NOTE — Discharge Instructions (Addendum)
We will call you if anything returns positive on your stool sample.  Otherwise it's likely viral etiology. You should increase fluid intake as much as tolerated. The most important treatment for diarrhea is drinking water.  Try the foods to firm up stool as well.  You may need a few more days to clear the virus.

## 2022-11-03 NOTE — ED Provider Notes (Signed)
[facility]    CSN: [id number] Arrival date & time: 11/03/22  1142     History   Chief Complaint Chief Complaint  Patient presents with   Diarrhea    HPI Derek [name] is a 61 y.o. male.  Presents with 4 days of diarrhea, reports he has 10 episodes of watery stool each day Occurs right after eating and drinking Some abdominal discomfort but denies severe pain No vomiting. No fevers. Denies hematochezia.   Has tried imodium and pepto  Denies recent travel. No new medications. No sick contacts. No recent hospitalization or antibiotic use. He did spend a lot of time in the hospital 2 weeks ago when his mom was admitted, but was not a patient himself  Past Medical History:  Diagnosis Date   Factor V Leiden (HCC)    History of DVT (deep vein thrombosis)    History of panic attacks    Left nephrolithiasis    Low testosterone    Right ureteral stone     Patient Active Problem List   Diagnosis Date Noted   Panic disorder without agoraphobia 09/18/2018   Factor V Leiden mutation (HCC) 09/02/2018    Past Surgical History:  Procedure Laterality Date   APPENDECTOMY     CYSTO/  RIGHT RETROGRADE PYELOGRAM/  RIGHT URETERAL STENT PLACEMENT  04-23-2014   CYSTOSCOPY W/ URETERAL STENT PLACEMENT Right 04/23/2014   Procedure: CYSTOSCOPY  RIGHT RETROGRADE PYELOGRAM/RIGHT URETERAL STENT PLACEMENT;  Surgeon: Magdalene Molly, MD;  Location: [facility];  Service: Urology;  Laterality: Right;   CYSTOSCOPY WITH RETROGRADE PYELOGRAM, URETEROSCOPY AND STENT PLACEMENT Right 05/03/2014   Procedure: CYSTOSCOPY WITH RETROGRADE PYELOGRAM, URETEROSCOPY AND STENT EXCHANGE;  Surgeon: Magdalene Molly, MD;  Location: [facility];  Service: Urology;  Laterality: Right;   HOLMIUM LASER APPLICATION Right 05/03/2014   Procedure: HOLMIUM LASER APPLICATION;  Surgeon: Magdalene Molly, MD;  Location: [facility];  Service: Urology;  Laterality: Right;    ROTATOR CUFF REPAIR      Home Medications    Prior to Admission medications   Medication Sig Start Date End Date Taking? Authorizing Provider  ALPRAZolam Prudy Feeler) 1 MG tablet TAKE 1 TABLET BY MOUTH TWICE A DAY AS NEEDED FOR ANXIETY 09/03/22   Corie Chiquito, PMHNP  ELIQUIS 5 MG TABS tablet Take 5 mg by mouth 2 (two) times daily. 06/24/18   [provider]  sertraline (ZOLOFT) 100 MG tablet TAKE 1/2 OR 1 TABLET BY MOUTH EVERY DAY 02/13/22   Corie Chiquito, PMHNP    Family History Family History  Problem Relation Age of Onset   Heart disease Paternal Grandmother        ? possible h/o heart attack   Anxiety disorder Mother    AAA (abdominal aortic aneurysm) Father    Heart attack Father 10    Social History Social History   Tobacco Use   Smoking status: Former    Packs/day: 0.50    Years: 20.00    Total pack years: 10.00    Types: Cigarettes    Quit date: 04/23/2009    Years since quitting: 13.5   Smokeless tobacco: Current    Types: Snuff, Chew   Tobacco comments:    occasionally dips / chewtobacco. Quit ~2015.  Substance Use Topics   Alcohol use: Yes    Alcohol/week: 0.0 standard drinks of alcohol    Comment: occasional - 1-2 beers/week   Drug use: No     Allergies  Patient has no known allergies.   Review of Systems Review of Systems  Gastrointestinal:  Positive for diarrhea.   As per HPI  Physical Exam Triage Vital Signs ED Triage Vitals  Enc Vitals Group     BP      Pulse      Resp      Temp      Temp src      SpO2      Weight      Height      Head Circumference      Peak Flow      Pain Score      Pain Loc      Pain Edu?      Excl. in GC?    No data found.  Updated Vital Signs BP 123/89 (BP Location: Right Arm)   Pulse 94   Temp 97.8 F (36.6 C) (Oral)   Resp 18   SpO2 96%   Physical Exam Vitals and nursing note reviewed.  Constitutional:      General: He is not in acute distress. HENT:     Mouth/Throat:     Mouth:  Mucous membranes are moist.     Pharynx: Oropharynx is clear.  Eyes:     General: No scleral icterus. Cardiovascular:     Rate and Rhythm: Normal rate and regular rhythm.     Pulses: Normal pulses.     Heart sounds: Normal heart sounds.  Pulmonary:     Effort: Pulmonary effort is normal.     Breath sounds: Normal breath sounds.  Abdominal:     General: Bowel sounds are normal. There is no distension.     Tenderness: There is no abdominal tenderness. There is no guarding.  Skin:    General: Skin is warm and dry.  Neurological:     Mental Status: He is alert and oriented to person, place, and time.     UC Treatments / Results  Labs (all labs ordered are listed, but only abnormal results are displayed)  EKG  Radiology No results found.  Procedures Procedures   Medications Ordered in UC Medications - No data to display  Initial Impression / Assessment and Plan / UC Course  I have reviewed the triage vital signs and the nursing notes.  Pertinent labs & imaging results that were available during my care of the patient were reviewed by me and considered in my medical decision making (see chart for details).  Afebrile and stable vitals. Abdomen non tender Attempted to get stool sample in clinic but patient couldn't have BM. Future order - will test GI panel with O&P. Adding C-diff since he spent some time in hospital but not likely cause.  Discussed increasing fluid intake as much as tolerated. Provided list of foods to firm stool. Discussed may take some time to clear. Return precautions discussed. Patient agrees to plan  Final Clinical Impressions(s) / UC Diagnoses   Final diagnoses:  Gastroenteritis  Diarrhea, unspecified type     Discharge Instructions      We will call you if anything returns positive on your stool sample.  Otherwise it's likely viral etiology. You should increase fluid intake as much as tolerated. The most important treatment for diarrhea  is drinking water.  Try the foods to firm up stool as well.  You may need a few more days to clear the virus.     ED Prescriptions   None    PDMP not reviewed this encounter.  Lakita Sahlin, [place] 11/03/22 1430

## 2022-11-04 ENCOUNTER — Telehealth (HOSPITAL_COMMUNITY): Payer: Self-pay | Admitting: Emergency Medicine

## 2022-11-04 NOTE — Telephone Encounter (Signed)
Patient brought stool sample to clinic today. Lab could not view c.diff order. Attempted to re-order but still unable to be viewed. Discontinue c.diff order and we will just run the GI panel and O&P. Low concern for c.diff.

## 2022-11-05 LAB — GASTROINTESTINAL PANEL BY PCR, STOOL (REPLACES STOOL CULTURE)

## 2022-11-07 LAB — O&P RESULT

## 2022-11-07 LAB — OVA + PARASITE EXAM

## 2022-12-04 ENCOUNTER — Ambulatory Visit: Payer: BC Managed Care – PPO | Admitting: Psychiatry

## 2022-12-05 ENCOUNTER — Other Ambulatory Visit: Payer: Self-pay | Admitting: Psychiatry

## 2022-12-05 DIAGNOSIS — F410 Panic disorder [episodic paroxysmal anxiety] without agoraphobia: Secondary | ICD-10-CM

## 2022-12-06 NOTE — Telephone Encounter (Signed)
Filled 12/4 appt 1/11

## 2022-12-13 ENCOUNTER — Ambulatory Visit (INDEPENDENT_AMBULATORY_CARE_PROVIDER_SITE_OTHER): Payer: BC Managed Care – PPO | Admitting: Psychiatry

## 2022-12-13 ENCOUNTER — Encounter: Payer: Self-pay | Admitting: Psychiatry

## 2022-12-13 DIAGNOSIS — F410 Panic disorder [episodic paroxysmal anxiety] without agoraphobia: Secondary | ICD-10-CM

## 2022-12-13 MED ORDER — SERTRALINE HCL 100 MG PO TABS: ORAL_TABLET | ORAL | 1 refills | Status: DC

## 2022-12-13 MED ORDER — ALPRAZOLAM 1 MG PO TABS: ORAL_TABLET | ORAL | 5 refills | Status: DC

## 2022-12-13 NOTE — Progress Notes (Signed)
ESTELL DILLINGER [id number] [birth year] 62 y.o.  Subjective:   Patient ID:  Derek [name] is a 62 y.o. (DOB 08-03-1961) male.  Chief Complaint:  Chief Complaint  Patient presents with   Follow-up    Panic    HPI Derek [name] presents to the office today for follow-up of panic. He reports that he had an anxiety flare in July and he had not been taking his medication as regularly at that time. He reports that he took Xanax more consistently and took some time off from work (about 4-5 days off). He reports occasional "flutter" of anxiety if he does not take meds consistently. He reports that he occasionally will start to hyper-ventilate without apparent trigger. He denies excessive worry. He reports that he is "fine" about "85% of the time." He reports that his PCP has completed a cardiac work-up and this was negative. Sleeping well. He reports that he usually wakes up at 4 am. He typically goes to bed around 9 pm. Denies depression. Energy and motivation have been ok. Denies diminished interest in things. Appetite has been good. Concentration has been good. Memory is "not as sharp as it was." Denies SI.   Mother had kidney removed due to renal cancer. She has one "spot" on her lung and will have some radiation. Son is doing well and is married. He reports that he and his son are close. 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   He reports that he usually takes Xanax 1 mg 1/2 tab 3-4 times daily.   Xanax last filled 12/06/21.   [place] [facility] from 11/03/2022 in [facility] [place] No Risk        Review of Systems:  Review of Systems  Gastrointestinal: Negative.   Musculoskeletal:  Negative for gait problem.  Neurological:  Negative for tremors.  Psychiatric/Behavioral:         Please refer to HPI    Medications: I have reviewed the patient's current medications.  Current Outpatient Medications  Medication Sig Dispense  Refill   ELIQUIS 5 MG TABS tablet Take 5 mg by mouth 2 (two) times daily.  3   [START ON 01/03/2023] ALPRAZolam (XANAX) 1 MG tablet TAKE 1 TABLET BY MOUTH TWICE A DAY AS NEEDED FOR ANXIETY 60 tablet 5   sertraline (ZOLOFT) 100 MG tablet TAKE 1/2 OR 1 TABLET BY MOUTH EVERY DAY 90 tablet 1   No current facility-administered medications for this visit.    Medication Side Effects: None  Allergies: No Known Allergies  Past Medical History:  Diagnosis Date   Factor V Leiden ([place])    History of DVT (deep vein thrombosis)    History of panic attacks    Left nephrolithiasis    Low testosterone    Right ureteral stone     Past Medical History, Surgical history, Social history, and Family history were reviewed and updated as appropriate.   Please see review of systems for further details on the patient's review from today.   Objective:   Physical Exam:  There were no vitals taken for this visit.  Physical Exam Constitutional:      General: He is not in acute distress. Musculoskeletal:        General: No deformity.  Neurological:     Mental Status: He is alert and oriented to person, place, and time.     Coordination: Coordination normal.  Psychiatric:  Attention and Perception: Attention and perception normal. He does not perceive auditory or visual hallucinations.        Mood and Affect: Mood normal. Mood is not anxious or depressed. Affect is not labile, blunt, angry or inappropriate.        Speech: Speech normal.        Behavior: Behavior normal.        Thought Content: Thought content normal. Thought content is not paranoid or delusional. Thought content does not include homicidal or suicidal ideation. Thought content does not include homicidal or suicidal plan.        Cognition and Memory: Cognition and memory normal.        Judgment: Judgment normal.     Comments: Insight intact     Lab Review:     Component Value Date/Time   NA 138 07/29/2018 1039   K 4.4  07/29/2018 1039   CL 105 07/29/2018 1039   CO2 25 07/29/2018 1039   GLUCOSE 114 (H) 07/29/2018 1039   BUN 16 07/29/2018 1039   CREATININE 1.14 07/29/2018 1039   CALCIUM 8.7 (L) 07/29/2018 1039   GFRNONAA >60 07/29/2018 1039   GFRAA >60 07/29/2018 1039       Component Value Date/Time   WBC 8.7 07/29/2018 1039   RBC 5.15 07/29/2018 1039   HGB 15.3 07/29/2018 1039   HCT 46.6 07/29/2018 1039   PLT 279 07/29/2018 1039   MCV 90.5 07/29/2018 1039   MCV 88.1 11/24/2014 1913   MCH 29.7 07/29/2018 1039   MCHC 32.8 07/29/2018 1039   RDW 12.7 07/29/2018 1039   LYMPHSABS 4.8 (H) 04/22/2014 1455   MONOABS 0.7 04/22/2014 1455   EOSABS 0.4 04/22/2014 1455   BASOSABS 0.0 04/22/2014 1455    No results found for: "POCLITH", "LITHIUM"   No results found for: "PHENYTOIN", "PHENOBARB", "VALPROATE", "CBMZ"   .res Assessment: Plan:   Will continue current plan of care since target signs and symptoms are well controlled without any tolerability issues. Continue Sertraline 100 mg 1/2-1 tab daily for anxiety.  Continue Alprazolam 1 mg po BID prn anxiety.  Pt to follow-up in one year or sooner if clinically indicated. Patient advised to contact office with any questions, adverse effects, or acute worsening in signs and symptoms.   Dandre was seen today for follow-up.  Diagnoses and all orders for this visit:  Panic disorder -     ALPRAZolam (XANAX) 1 MG tablet; TAKE 1 TABLET BY MOUTH TWICE A DAY AS NEEDED FOR ANXIETY -     sertraline (ZOLOFT) 100 MG tablet; TAKE 1/2 OR 1 TABLET BY MOUTH EVERY DAY     Please see After Visit Summary for patient specific instructions.  Future Appointments  Date Time Provider [place]  12/16/2023  4:00 PM Thayer Headings, PMHNP CP-CP None     No orders of the defined types were placed in this encounter.   -------------------------------

## 2022-12-27 ENCOUNTER — Other Ambulatory Visit: Payer: Self-pay

## 2022-12-27 ENCOUNTER — Emergency Department (HOSPITAL_COMMUNITY)
Admission: EM | Admit: 2022-12-27 | Discharge: 2022-12-28 | Disposition: A | Discharge status: Home / Self Care | Payer: BC Managed Care – PPO | Attending: Emergency Medicine | Admitting: Emergency Medicine

## 2022-12-27 ENCOUNTER — Other Ambulatory Visit (HOSPITAL_COMMUNITY): Payer: BC Managed Care – PPO

## 2022-12-27 ENCOUNTER — Emergency Department (HOSPITAL_COMMUNITY): Payer: BC Managed Care – PPO

## 2022-12-27 DIAGNOSIS — N132 Hydronephrosis with renal and ureteral calculous obstruction: Secondary | ICD-10-CM | POA: Diagnosis not present

## 2022-12-27 DIAGNOSIS — Z7901 Long term (current) use of anticoagulants: Secondary | ICD-10-CM | POA: Diagnosis not present

## 2022-12-27 DIAGNOSIS — R109 Unspecified abdominal pain: Secondary | ICD-10-CM | POA: Diagnosis not present

## 2022-12-27 DIAGNOSIS — N200 Calculus of kidney: Secondary | ICD-10-CM | POA: Diagnosis not present

## 2022-12-27 DIAGNOSIS — K5730 Diverticulosis of large intestine without perforation or abscess without bleeding: Secondary | ICD-10-CM | POA: Diagnosis not present

## 2022-12-27 DIAGNOSIS — I700 Atherosclerosis of aorta: Secondary | ICD-10-CM | POA: Diagnosis not present

## 2022-12-27 LAB — BASIC METABOLIC PANEL
Anion gap: 7 (ref 5–15)
BUN: 16 mg/dL (ref 6–20)
CO2: 22 mmol/L (ref 22–32)
Calcium: 8.6 mg/dL — ABNORMAL LOW (ref 8.9–10.3)
Chloride: 106 mmol/L (ref 98–111)
Creatinine, Ser: 1.18 mg/dL (ref 0.61–1.24)
GFR, Estimated: >60 mL/min (ref >60)
Glucose, Bld: 115 mg/dL — ABNORMAL HIGH (ref 70–99)
Potassium: 4 mmol/L (ref 3.5–5.1)
Sodium: 135 mmol/L (ref 135–145)

## 2022-12-27 LAB — CBC WITH DIFFERENTIAL/PLATELET
Abs Immature Granulocytes: 0.02 10*3/uL (ref 0.00–0.07)
Basophils Absolute: 0.1 10*3/uL (ref 0.0–0.1)
Basophils Relative: 1 %
Eosinophils Absolute: 0.1 10*3/uL (ref 0.0–0.5)
Eosinophils Relative: 2 %
HCT: 51.2 % (ref 39.0–52.0)
Hemoglobin: 16.7 g/dL (ref 13.0–17.0)
Immature Granulocytes: 0 %
Lymphocytes Relative: 29 %
Lymphs Abs: 2.1 10*3/uL (ref 0.7–4.0)
MCH: 29.6 pg (ref 26.0–34.0)
MCHC: 32.6 g/dL (ref 30.0–36.0)
MCV: 90.6 fL (ref 80.0–100.0)
Monocytes Absolute: 0.4 10*3/uL (ref 0.1–1.0)
Monocytes Relative: 5 %
Neutro Abs: 4.6 10*3/uL (ref 1.7–7.7)
Neutrophils Relative %: 63 %
Platelets: 255 10*3/uL (ref 150–400)
RBC: 5.65 MIL/uL (ref 4.22–5.81)
RDW: 13.1 % (ref 11.5–15.5)
WBC: 7.2 10*3/uL (ref 4.0–10.5)
nRBC: 0 % (ref 0.0–0.2)

## 2022-12-27 MED ORDER — MORPHINE SULFATE (PF) 2 MG/ML IV SOLN
2.0000 mg | Freq: Once | INTRAVENOUS | Status: AC
  Administered 2022-12-27: 2 mg via INTRAVENOUS
  Filled 2022-12-27: qty 1

## 2022-12-27 MED ORDER — ONDANSETRON HCL 4 MG/2ML IJ SOLN
4.0000 mg | Freq: Once | INTRAMUSCULAR | Status: AC
  Administered 2022-12-28: 4 mg via INTRAVENOUS
  Filled 2022-12-27: qty 2

## 2022-12-27 MED ORDER — KETOROLAC TROMETHAMINE 15 MG/ML IJ SOLN: 15.0000 mg | Freq: Once | INTRAMUSCULAR | Status: DC

## 2022-12-27 MED ORDER — HYDROMORPHONE HCL 1 MG/ML IJ SOLN
1.0000 mg | Freq: Once | INTRAMUSCULAR | Status: AC
  Administered 2022-12-28: 1 mg via INTRAVENOUS
  Filled 2022-12-27: qty 1

## 2022-12-27 MED ORDER — ONDANSETRON HCL 4 MG/2ML IJ SOLN
4.0000 mg | Freq: Once | INTRAMUSCULAR | Status: AC
  Administered 2022-12-27: 4 mg via INTRAVENOUS
  Filled 2022-12-27: qty 2

## 2022-12-27 NOTE — ED Provider Triage Note (Signed)
Emergency Medicine Provider Triage Evaluation Note  Derek [name] , a 62 y.o. male  was evaluated in triage.  Pt complains of right side flank pain that began earlier today.  Patient does have history significant for kidney stones.  He has been able to urinate "a little bit" and has not noticed any blood in his urine.    Review of Systems  Positive: As above Negative: As above  Physical Exam  BP (!) 171/116 (BP Location: Left Arm)   Pulse 61   Temp 97.6 F (36.4 C) (Oral)   Resp 18   Wt 104.3 kg   SpO2 97%   BMI 31.19 kg/m  Gen:   Awake, no distress   Resp:  Normal effort  MSK:   Moves extremities without difficulty  Other:  [name] CVA tenderness  Medical Decision Making  Medically screening exam initiated at 10:35 PM.  Appropriate orders placed.  Derek [name] was informed that the remainder of the evaluation will be completed by another provider, this initial triage assessment does not replace that evaluation, and the importance of remaining in the ED until their evaluation is complete.     Derek [name], [place] 12/27/22 2236

## 2022-12-27 NOTE — ED Provider Notes (Incomplete)
[place] EMERGENCY DEPARTMENT AT [facility] Provider Note   CSN: [id number] Arrival date & time: 12/27/22  2215     History {Add pertinent medical, surgical, social history, OB history to HPI:1} Chief Complaint  Patient presents with  . Flank Pain    Curran Lenderman Belling is a 62 y.o. male.  HPI   Medical history including kidney stones, DVT, currently on Eliquis presents with complaints of right-sided flank tenderness.  The tenderness started suddenly around 6 PM, states remains in his right flank does not radiate, he has difficulty urination denies dysuria hematuria still passing gas having normal bowel movements denies bloody stools or dark tarry stools, associated nausea and vomiting no bloody emesis or coffee-ground emesis, states that he has a history of kidney stones and feels like his stone.  He denies any fever chills cough congestion.    Home Medications Prior to Admission medications   Medication Sig Start Date End Date Taking? Authorizing Provider  ALPRAZolam Prudy Feeler) 1 MG tablet TAKE 1 TABLET BY MOUTH TWICE A DAY AS NEEDED FOR ANXIETY 01/03/23   Corie Chiquito, PMHNP  ELIQUIS 5 MG TABS tablet Take 5 mg by mouth 2 (two) times daily. 06/24/18   [provider]  sertraline (ZOLOFT) 100 MG tablet TAKE 1/2 OR 1 TABLET BY MOUTH EVERY DAY 12/13/22   Corie Chiquito, PMHNP      Allergies    Patient has no known allergies.    Review of Systems   Review of Systems  Constitutional:  Negative for chills and fever.  Respiratory:  Negative for shortness of breath.   Cardiovascular:  Negative for chest pain.  Gastrointestinal:  Negative for abdominal pain.  Genitourinary:  Positive for difficulty urinating and flank pain.  Neurological:  Negative for headaches.    Physical Exam Updated Vital Signs BP (!) 171/116 (BP Location: Left Arm)   Pulse 61   Temp 97.6 F (36.4 C) (Oral)   Resp 18   Wt 104.3 kg   SpO2 97%   BMI 31.19 kg/m  Physical Exam Vitals and  nursing note reviewed.  Constitutional:      General: He is not in acute distress.    Appearance: He is not ill-appearing.  HENT:     Head: Normocephalic and atraumatic.     Nose: No congestion.  Eyes:     Conjunctiva/sclera: Conjunctivae normal.  Cardiovascular:     Rate and Rhythm: Normal rate and regular rhythm.     Pulses: Normal pulses.     Heart sounds: No murmur heard.    No friction rub. No gallop.  Pulmonary:     Effort: No respiratory distress.     Breath sounds: No wheezing, rhonchi or rales.  Abdominal:     Palpations: Abdomen is soft.     Tenderness: There is abdominal tenderness. There is right CVA tenderness. There is no left CVA tenderness.     Comments: Abdomen nondistended, soft, no guarding rebound tenderness or peritoneal sign, he had noted right-sided flank tenderness and positive CVA tenderness.  Musculoskeletal:     Right lower leg: No edema.     Left lower leg: No edema.  Skin:    General: Skin is warm and dry.  Neurological:     Mental Status: He is alert.  Psychiatric:        Mood and Affect: Mood normal.     ED Results / Procedures / Treatments   Labs (all labs ordered are listed, but only abnormal results are  displayed) Labs Reviewed  BASIC METABOLIC PANEL - Abnormal; Notable for the following components:      Result Value   Glucose, Bld 115 (*)    Calcium 8.6 (*)    All other components within normal limits  CBC WITH DIFFERENTIAL/PLATELET  URINALYSIS, ROUTINE W REFLEX MICROSCOPIC    EKG None  Radiology CT Renal Stone Study  Result Date: 12/27/2022 CLINICAL DATA:  Flank pain. EXAM: CT ABDOMEN AND PELVIS WITHOUT CONTRAST TECHNIQUE: Multidetector CT imaging of the abdomen and pelvis was performed following the standard protocol without IV contrast. RADIATION DOSE REDUCTION: This exam was performed according to the departmental dose-optimization program which includes automated exposure control, adjustment of the mA and/or kV according to  patient size and/or use of iterative reconstruction technique. COMPARISON:  CT abdomen pelvis dated 04/22/2014. FINDINGS: Evaluation of this exam is limited in the absence of intravenous contrast. Lower chest: The visualized lung bases are clear. No intra-abdominal free air or free fluid. Hepatobiliary: No focal liver abnormality is seen. No gallstones, gallbladder wall thickening, or biliary dilatation. Pancreas: Unremarkable. No pancreatic ductal dilatation or surrounding inflammatory changes. Spleen: Normal in size without focal abnormality. Adrenals/Urinary Tract: The adrenal glands are unremarkable. There is a 7 mm stone in the distal right ureter with mild right hydronephrosis. There is right perinephric stranding. Correlation with urinalysis recommended to exclude superimposed UTI. The left kidney, left ureter, and urinary bladder appear unremarkable. Stomach/Bowel: There is sigmoid diverticulosis without active inflammatory changes. There is no bowel obstruction or active inflammation. The appendix is not visualized with certainty. No inflammatory changes identified in the right lower quadrant. Vascular/Lymphatic: Mild aortoiliac atherosclerotic disease. The IVC is unremarkable. There is a retroaortic left renal vein anatomy. No portal venous gas. There is no adenopathy. Reproductive: The prostate and seminal vesicles are grossly unremarkable. No acute mass. Other: None Musculoskeletal: Degenerative changes of the spine. No acute osseous pathology. IMPRESSION: 1. A 7 mm distal right ureteral stone with mild right hydronephrosis. 2. Sigmoid diverticulosis. No bowel obstruction. 3.  Aortic Atherosclerosis (ICD10-I70.0). Electronically Signed   By: Anner Crete M.D.   On: 12/27/2022 23:15    Procedures Procedures  {Document cardiac monitor, telemetry assessment procedure when appropriate:1}  Medications Ordered in ED Medications  ondansetron (ZOFRAN) injection 4 mg (has no administration in time  range)  HYDROmorphone (DILAUDID) injection 1 mg (has no administration in time range)  ketorolac (TORADOL) 15 MG/ML injection 15 mg (has no administration in time range)  ondansetron (ZOFRAN) injection 4 mg (4 mg Intravenous Given 12/27/22 2242)  morphine (PF) 2 MG/ML injection 2 mg (2 mg Intravenous Given 12/27/22 2242)    ED Course/ Medical Decision Making/ A&P   {   Click here for ABCD2, HEART and other calculatorsREFRESH Note before signing :1}                          Medical Decision Making Risk Prescription drug management.   This patient presents to the ED for concern of flank pain, this involves an extensive number of treatment options, and is a complaint that carries with it a high risk of complications and morbidity.  The differential diagnosis includes AAA, UTI, pyelo-, kidney stone,    Additional history obtained:  Additional history obtained from *** External records from outside source obtained and reviewed including ***   Co morbidities that complicate the patient evaluation  ***  Social Determinants of Health:  ***    Lab Tests:  I  Ordered, and personally interpreted labs.  The pertinent results include:  ***   Imaging Studies ordered:  I ordered imaging studies including ***  I independently visualized and interpreted imaging which showed *** I agree with the radiologist interpretation   Cardiac Monitoring:  The patient was maintained on a cardiac monitor.  I personally viewed and interpreted the cardiac monitored which showed an underlying rhythm of: ***   Medicines ordered and prescription drug management:  I ordered medication including ***  for ***  I have reviewed the patients home medicines and have made adjustments as needed  Critical Interventions:  ***   Reevaluation:  After the interventions noted above, I reevaluated the patient and found that they have :{resolved/improved/worsened:23923::"improved"}  Consultations  Obtained:  I requested consultation with the ***,  and discussed lab and imaging findings as well as pertinent plan - they recommend: ***    Test Considered:  ***    Rule out ****    Dispostion and problem list  After consideration of the diagnostic results and the patients response to treatment, I feel that the patent would benefit from ***.       {Document critical care time when appropriate:1} {Document review of labs and clinical decision tools ie heart score, Chads2Vasc2 etc:1}  {Document your independent review of radiology images, and any outside records:1} {Document your discussion with family members, caretakers, and with consultants:1} {Document social determinants of health affecting pt's care:1} {Document your decision making why or why not admission, treatments were needed:1} Final Clinical Impression(s) / ED Diagnoses Final diagnoses:  None    Rx / DC Orders ED Discharge Orders     None

## 2022-12-27 NOTE — ED Triage Notes (Signed)
R flank pain that started at 7pm today.  H/o frequent kidney stones requiring procedures to remove.   Denies blood in urine  Still able to void, last output 1 hr ago

## 2022-12-28 ENCOUNTER — Other Ambulatory Visit: Payer: Self-pay | Admitting: Urology

## 2022-12-28 ENCOUNTER — Encounter (HOSPITAL_COMMUNITY): Payer: Self-pay

## 2022-12-28 DIAGNOSIS — N132 Hydronephrosis with renal and ureteral calculous obstruction: Secondary | ICD-10-CM | POA: Diagnosis not present

## 2022-12-28 LAB — URINALYSIS, ROUTINE W REFLEX MICROSCOPIC
Bacteria, UA: NONE SEEN
Bilirubin Urine: NEGATIVE
Glucose, UA: NEGATIVE mg/dL
Ketones, ur: NEGATIVE mg/dL
Leukocytes,Ua: NEGATIVE
Nitrite: NEGATIVE
Protein, ur: NEGATIVE mg/dL
RBC / HPF: >50 RBC/hpf (ref 0–5)
Specific Gravity, Urine: 1.02 (ref 1.005–1.030)
pH: 6 (ref 5.0–8.0)

## 2022-12-28 MED ORDER — ONDANSETRON HCL 4 MG PO TABS: 4.0000 mg | ORAL_TABLET | Freq: Four times a day (QID) | ORAL | 0 refills | Status: DC

## 2022-12-28 MED ORDER — TAMSULOSIN HCL 0.4 MG PO CAPS: 0.4000 mg | ORAL_CAPSULE | Freq: Every day | ORAL | 0 refills | Status: DC

## 2022-12-28 MED ORDER — OXYCODONE-ACETAMINOPHEN 5-325 MG PO TABS: 1.0000 | ORAL_TABLET | Freq: Three times a day (TID) | ORAL | 0 refills | Status: AC | PRN

## 2022-12-28 NOTE — ED Provider Notes (Signed)
[facility] Provider Note   CSN: [id number] Arrival date & time: 12/27/22  2215     History  Chief Complaint  Patient presents with   Flank Pain    Derek [name] is a 62 y.o. male.  HPI   Medical history including kidney stones, DVT, currently on Eliquis presents with complaints of right-sided flank tenderness.  The tenderness started suddenly around 6 PM, states remains in his right flank does not radiate, he has difficulty urination denies dysuria hematuria still passing gas having normal bowel movements denies bloody stools or dark tarry stools, associated nausea and vomiting no bloody emesis or coffee-ground emesis, states that he has a history of kidney stones and feels like his stone.  He denies any fever chills cough congestion.    Home Medications Prior to Admission medications   Medication Sig Start Date End Date Taking? Authorizing Provider  ondansetron (ZOFRAN) 4 MG tablet Take 1 tablet (4 mg total) by mouth every 6 (six) hours. 12/28/22  Yes Marcello Fennel, [name]  oxyCODONE-acetaminophen (PERCOCET/ROXICET) 5-325 MG tablet Take 1 tablet by mouth every 8 (eight) hours as needed for up to 4 days for severe pain. 12/28/22 01/01/23 Yes Marcello Fennel, [name]  tamsulosin (FLOMAX) 0.4 MG CAPS capsule Take 1 capsule (0.4 mg total) by mouth daily for 14 days. 12/28/22 01/11/23 Yes Marcello Fennel, [name]  ALPRAZolam Duanne Moron) 1 MG tablet TAKE 1 TABLET BY MOUTH TWICE A DAY AS NEEDED FOR ANXIETY 01/03/23   Thayer Headings, PMHNP  ELIQUIS 5 MG TABS tablet Take 5 mg by mouth 2 (two) times daily. 06/24/18   [provider]  sertraline (ZOLOFT) 100 MG tablet TAKE 1/2 OR 1 TABLET BY MOUTH EVERY DAY 12/13/22   Thayer Headings, PMHNP      Allergies    Patient has no known allergies.    Review of Systems   Review of Systems  Constitutional:  Negative for chills and fever.  Respiratory:  Negative for shortness of breath.    Cardiovascular:  Negative for chest pain.  Gastrointestinal:  Negative for abdominal pain.  Genitourinary:  Positive for difficulty urinating and flank pain.  Neurological:  Negative for headaches.    Physical Exam Updated Vital Signs BP 136/85   Pulse 72   Temp 97.6 F (36.4 C) (Oral)   Resp 18   Ht 6' (1.829 m)   Wt 104.3 kg   SpO2 95%   BMI 31.19 kg/m  Physical Exam Vitals and nursing note reviewed.  Constitutional:      General: He is not in acute distress.    Appearance: He is not ill-appearing.  HENT:     Head: Normocephalic and atraumatic.     Nose: No congestion.  Eyes:     Conjunctiva/sclera: Conjunctivae normal.  Cardiovascular:     Rate and Rhythm: Normal rate and regular rhythm.     Pulses: Normal pulses.     Heart sounds: No murmur heard.    No friction rub. No gallop.  Pulmonary:     Effort: No respiratory distress.     Breath sounds: No wheezing, rhonchi or rales.  Abdominal:     Palpations: Abdomen is soft.     Tenderness: There is abdominal tenderness. There is right CVA tenderness. There is no left CVA tenderness.     Comments: Abdomen nondistended, soft, no guarding rebound tenderness or peritoneal sign, he had noted right-sided flank tenderness and positive CVA tenderness.  Musculoskeletal:  Right lower leg: No edema.     Left lower leg: No edema.  Skin:    General: Skin is warm and dry.  Neurological:     Mental Status: He is alert.  Psychiatric:        Mood and Affect: Mood normal.     ED Results / Procedures / Treatments   Labs (all labs ordered are listed, but only abnormal results are displayed) Labs Reviewed  URINALYSIS, ROUTINE W REFLEX MICROSCOPIC - Abnormal; Notable for the following components:      Result Value   APPearance HAZY (*)    Hgb urine dipstick LARGE (*)    All other components within normal limits  BASIC METABOLIC PANEL - Abnormal; Notable for the following components:   Glucose, Bld 115 (*)    Calcium 8.6  (*)    All other components within normal limits  CBC WITH DIFFERENTIAL/PLATELET    EKG None  Radiology CT Renal Stone Study  Result Date: 12/27/2022 CLINICAL DATA:  Flank pain. EXAM: CT ABDOMEN AND PELVIS WITHOUT CONTRAST TECHNIQUE: Multidetector CT imaging of the abdomen and pelvis was performed following the standard protocol without IV contrast. RADIATION DOSE REDUCTION: This exam was performed according to the departmental dose-optimization program which includes automated exposure control, adjustment of the mA and/or kV according to patient size and/or use of iterative reconstruction technique. COMPARISON:  CT abdomen pelvis dated 04/22/2014. FINDINGS: Evaluation of this exam is limited in the absence of intravenous contrast. Lower chest: The visualized lung bases are clear. No intra-abdominal free air or free fluid. Hepatobiliary: No focal liver abnormality is seen. No gallstones, gallbladder wall thickening, or biliary dilatation. Pancreas: Unremarkable. No pancreatic ductal dilatation or surrounding inflammatory changes. Spleen: Normal in size without focal abnormality. Adrenals/Urinary Tract: The adrenal glands are unremarkable. There is a 7 mm stone in the distal right ureter with mild right hydronephrosis. There is right perinephric stranding. Correlation with urinalysis recommended to exclude superimposed UTI. The left kidney, left ureter, and urinary bladder appear unremarkable. Stomach/Bowel: There is sigmoid diverticulosis without active inflammatory changes. There is no bowel obstruction or active inflammation. The appendix is not visualized with certainty. No inflammatory changes identified in the right lower quadrant. Vascular/Lymphatic: Mild aortoiliac atherosclerotic disease. The IVC is unremarkable. There is a retroaortic left renal vein anatomy. No portal venous gas. There is no adenopathy. Reproductive: The prostate and seminal vesicles are grossly unremarkable. No acute mass.  Other: None Musculoskeletal: Degenerative changes of the spine. No acute osseous pathology. IMPRESSION: 1. A 7 mm distal right ureteral stone with mild right hydronephrosis. 2. Sigmoid diverticulosis. No bowel obstruction. 3.  Aortic Atherosclerosis (ICD10-I70.0). Electronically Signed   By: Anner Crete M.D.   On: 12/27/2022 23:15    Procedures Procedures    Medications Ordered in ED Medications  ondansetron (ZOFRAN) injection 4 mg (4 mg Intravenous Given 12/27/22 2242)  morphine (PF) 2 MG/ML injection 2 mg (2 mg Intravenous Given 12/27/22 2242)  ondansetron (ZOFRAN) injection 4 mg (4 mg Intravenous Given 12/28/22 0018)  HYDROmorphone (DILAUDID) injection 1 mg (1 mg Intravenous Given 12/28/22 0018)    ED Course/ Medical Decision Making/ A&P                            Medical Decision Making Risk Prescription drug management.   This patient presents to the ED for concern of flank pain, this involves an extensive number of treatment options, and is a complaint that carries  with it a high risk of complications and morbidity.  The differential diagnosis includes AAA, UTI, pyelo-, kidney stone, dissection    Additional history obtained:  Additional history obtained from N/A External records from outside source obtained and reviewed including urgent care notes   Co morbidities that complicate the patient evaluation  On anticoag's  Social Determinants of Health:  N/A    Lab Tests:  I Ordered, and personally interpreted labs.  The pertinent results include: CBC is unremarkable, BMP shows glucose 115, calcium 8.6, UA unremarkable   Imaging Studies ordered:  I ordered imaging studies including CT renal I independently visualized and interpreted imaging which showed 7 mm stone in the right distal ureter mild hydronephrosis I agree with the radiologist interpretation   Cardiac Monitoring:  The patient was maintained on a cardiac monitor.  I personally viewed and  interpreted the cardiac monitored which showed an underlying rhythm of: N/A   Medicines ordered and prescription drug management:  I ordered medication including Zofran, pain medication I have reviewed the patients home medicines and have made adjustments as needed  Critical Interventions:  N/A   Reevaluation:  Presents with right-sided flank pain, triage obtain lab work imaging which I personally reviewed, remarkable for a 7 mm stone, exam is consistent with CT imaging, will provide with pain medications, antiemetics, and await UA.  Patient was reassessed, states she is feeling much better, rates the pain 2 out of 10, UA is unremarkable no evidence of infection, he is agreement discharge at this time.    Consultations Obtained:  N/a    Test Considered:  Hospitalization-deferred as patient's pain is well-controlled, tolerating p.o., there is no evidence of infected stone or evidence of AKI.    Rule out Suspicion for bowel obstruction, perforated diverticulitis, volvulus, AAA, pyelolow at this time as CT imaging is all negative these findings.  I doubt dissection as presentation is atypical, pain is focalized reproducible, has no risk factors i.e. he does not have uncontrolled hypertension, no connective tissue disorders, no family history of this.  Presentation more consistent with kidney stone seen on CT imaging.      Dispostion and problem list  After consideration of the diagnostic results and the patients response to treatment, I feel that the patent would benefit from discharge.  Right-sided kidney stone-patient be discharged home with pain medications, antiemetics, Flomax, and have follow-up with urology, given strict return precautions.            Final Clinical Impression(s) / ED Diagnoses Final diagnoses:  Kidney stone    Rx / DC Orders ED Discharge Orders          Ordered    oxyCODONE-acetaminophen (PERCOCET/ROXICET) 5-325 MG tablet  Every 8  hours PRN        12/28/22 0131    ondansetron (ZOFRAN) 4 MG tablet  Every 6 hours        12/28/22 0131    tamsulosin (FLOMAX) 0.4 MG CAPS capsule  Daily        12/28/22 0131              Carroll Sage, [name] 12/28/22 0133    Tilden Fossa, MD 12/28/22 [phone]

## 2022-12-28 NOTE — Discharge Instructions (Signed)
You have a kidney stone on your right side, it is 7 mm, I have started you on pain medications please take as prescribed, given you Zofran please use for nausea, given a medication called Flomax to help dilate your ureter to help pass your stone, this medication can lower your blood pressure if you start to feel lightheaded dizziness please discontinue this medication  I have given you a short course of narcotics please take as prescribed.  This medication can make you drowsy do not consume alcohol or operate heavy machinery when taking this medication.  This medication is Tylenol in it do not take Tylenol and take this medication  Please call urology today for further evaluation  If you note that you are having fevers, chills, worsening pain, uncontrolled nausea vomiting despite the medication given you please come back in for reassessment.

## 2023-01-03 NOTE — Progress Notes (Signed)
Pre-op phone call complete. Procedure date and arrival time confirmed. Patient allergies, medications, and medical history verified. Patient advised to stop Eloquis 01/04/2023. Patient advised not to take Xanax the morning of procedure. Patient denies any recent history of chest pain or COVID. Patient can have clear liquids until 0430. Patient has not received blue folder for procedure. RN recommended calling office to check status of folder. Driver secured.

## 2023-01-03 NOTE — Progress Notes (Signed)
Left message requesting call back about upcoming ESWL procedure.

## 2023-01-07 ENCOUNTER — Encounter (HOSPITAL_BASED_OUTPATIENT_CLINIC_OR_DEPARTMENT_OTHER)
Admission: RE | Disposition: A | Discharge status: Home / Self Care | Payer: Self-pay | Source: Home / Self Care | Attending: Urology

## 2023-01-07 ENCOUNTER — Encounter (HOSPITAL_BASED_OUTPATIENT_CLINIC_OR_DEPARTMENT_OTHER): Payer: Self-pay | Admitting: Urology

## 2023-01-07 ENCOUNTER — Ambulatory Visit (HOSPITAL_BASED_OUTPATIENT_CLINIC_OR_DEPARTMENT_OTHER)
Admission: RE | Admit: 2023-01-07 | Discharge: 2023-01-07 | Disposition: A | Discharge status: Home / Self Care | Payer: BC Managed Care – PPO | Attending: Urology | Admitting: Urology

## 2023-01-07 ENCOUNTER — Other Ambulatory Visit: Payer: Self-pay

## 2023-01-07 ENCOUNTER — Ambulatory Visit (HOSPITAL_COMMUNITY): Payer: BC Managed Care – PPO

## 2023-01-07 DIAGNOSIS — E669 Obesity, unspecified: Secondary | ICD-10-CM | POA: Diagnosis not present

## 2023-01-07 DIAGNOSIS — D6851 Activated protein C resistance: Secondary | ICD-10-CM | POA: Insufficient documentation

## 2023-01-07 DIAGNOSIS — Z7901 Long term (current) use of anticoagulants: Secondary | ICD-10-CM | POA: Insufficient documentation

## 2023-01-07 DIAGNOSIS — Z6833 Body mass index (BMI) 33.0-33.9, adult: Secondary | ICD-10-CM | POA: Insufficient documentation

## 2023-01-07 DIAGNOSIS — N201 Calculus of ureter: Secondary | ICD-10-CM | POA: Diagnosis not present

## 2023-01-07 HISTORY — PX: EXTRACORPOREAL SHOCK WAVE LITHOTRIPSY: SHX1557

## 2023-01-07 SURGERY — LITHOTRIPSY, ESWL
Anesthesia: LOCAL | Laterality: Right

## 2023-01-07 MED ORDER — OXYCODONE-ACETAMINOPHEN 5-325 MG PO TABS: 1.0000 | ORAL_TABLET | Freq: Four times a day (QID) | ORAL | 0 refills | Status: AC | PRN

## 2023-01-07 MED ORDER — CIPROFLOXACIN HCL 500 MG PO TABS
ORAL_TABLET | ORAL | Status: AC
  Filled 2023-01-07: qty 1

## 2023-01-07 MED ORDER — TAMSULOSIN HCL 0.4 MG PO CAPS: 0.4000 mg | ORAL_CAPSULE | Freq: Every day | ORAL | 0 refills | Status: AC

## 2023-01-07 MED ORDER — SODIUM CHLORIDE 0.9 % IV SOLN: INTRAVENOUS | Status: DC

## 2023-01-07 MED ORDER — DIPHENHYDRAMINE HCL 25 MG PO CAPS
ORAL_CAPSULE | ORAL | Status: AC
  Filled 2023-01-07: qty 1

## 2023-01-07 MED ORDER — DIAZEPAM 5 MG PO TABS
ORAL_TABLET | ORAL | Status: AC
  Filled 2023-01-07: qty 2

## 2023-01-07 MED ORDER — CIPROFLOXACIN HCL 500 MG PO TABS
500.0000 mg | ORAL_TABLET | ORAL | Status: AC
  Administered 2023-01-07: 500 mg via ORAL

## 2023-01-07 MED ORDER — DIAZEPAM 5 MG PO TABS
10.0000 mg | ORAL_TABLET | ORAL | Status: AC
  Administered 2023-01-07: 10 mg via ORAL

## 2023-01-07 MED ORDER — DIPHENHYDRAMINE HCL 25 MG PO CAPS
25.0000 mg | ORAL_CAPSULE | ORAL | Status: AC
  Administered 2023-01-07: 25 mg via ORAL

## 2023-01-07 NOTE — Discharge Instructions (Addendum)
1. You should strain your urine and collect all fragments and bring them to your follow up appointment.  2. You should take your pain medication as needed.  Please call if your pain is severe to the point that it is not controlled with your pain medication. 3. You should call if you develop fever > 101 or persistent nausea or vomiting. 4. Your doctor may prescribe tamsulosin to take to help facilitate stone passage.    Post Anesthesia Home Care Instructions  Activity: Get plenty of rest for the remainder of the day. A responsible individual must stay with you for 24 hours following the procedure.  For the next 24 hours, DO NOT: -Drive a car -Operate machinery -Drink alcoholic beverages -Take any medication unless instructed by your physician -Make any legal decisions or sign important papers.  Meals: Start with liquid foods such as gelatin or soup. Progress to regular foods as tolerated. Avoid greasy, spicy, heavy foods. If nausea and/or vomiting occur, drink only clear liquids until the nausea and/or vomiting subsides. Call your physician if vomiting continues.  Special Instructions/Symptoms: Your throat may feel dry or sore from the anesthesia or the breathing tube placed in your throat during surgery. If this causes discomfort, gargle with warm salt water. The discomfort should disappear within 24 hours.  

## 2023-01-07 NOTE — Op Note (Signed)
See [facility] operative note scanned into chart. Also because of the size, density, location and other factors that cannot be anticipated I feel this will likely be a staged procedure. This fact supersedes any indication in the scanned [facility] operative note to the contrary.  Luke Angelea Penny MD 01/07/2023, 12:50 PM  [facility]  Pager: [phone]  

## 2023-01-07 NOTE — H&P (Signed)
H&P  History of Present Illness: Derek [name] is a 62 y.o. year old M who presents today for R ESWL to treat a right mid ureteral stone  Past Medical History:  Diagnosis Date   Factor V Leiden ([place])    History of DVT (deep vein thrombosis)    History of panic attacks    Left nephrolithiasis    Low testosterone    Right ureteral stone     Past Surgical History:  Procedure Laterality Date   APPENDECTOMY     CYSTO/  RIGHT RETROGRADE PYELOGRAM/  RIGHT URETERAL STENT PLACEMENT  04-23-2014   CYSTOSCOPY W/ URETERAL STENT PLACEMENT Right 04/23/2014   Procedure: CYSTOSCOPY  RIGHT RETROGRADE PYELOGRAM/RIGHT URETERAL STENT PLACEMENT;  Surgeon: Sharyn Creamer, MD;  Location: [facility];  Service: Urology;  Laterality: Right;   CYSTOSCOPY WITH RETROGRADE PYELOGRAM, URETEROSCOPY AND STENT PLACEMENT Right 05/03/2014   Procedure: CYSTOSCOPY WITH RETROGRADE PYELOGRAM, URETEROSCOPY AND STENT EXCHANGE;  Surgeon: Sharyn Creamer, MD;  Location[facility];  Service: Urology;  Laterality: Right;   HOLMIUM LASER APPLICATION Right 03/03/9621   Procedure: HOLMIUM LASER APPLICATION;  Surgeon: Sharyn Creamer, MD;  Location: [facility];  Service: Urology;  Laterality: Right;   ROTATOR CUFF REPAIR      Home Medications:  No outpatient medications have been marked as taking for the 01/07/23 encounter [facility] Encounter).    Allergies: No Known Allergies  Family History  Problem Relation Age of Onset   Heart disease Paternal Grandmother        ? possible h/o heart attack   Anxiety disorder Mother    AAA (abdominal aortic aneurysm) Father    Heart attack Father 95    Social History:  reports that he quit smoking about 13 years ago. His smoking use included cigarettes. He has a 10.00 pack-year smoking history. His smokeless tobacco use includes snuff and chew. He reports current alcohol use. He reports that he does not use drugs.  ROS: A complete review  of systems was performed.  All systems are negative except for pertinent findings as noted.  Physical Exam:  Vital signs in last 24 hours:   Constitutional:  Alert and oriented, No acute distress Cardiovascular: Regular rate and rhythm Respiratory: Normal respiratory effort, Lungs clear bilaterally GI: Abdomen is soft, nontender, nondistended, no abdominal masses Lymphatic: No lymphadenopathy Neurologic: Grossly intact, no focal deficits Psychiatric: Normal mood and affect   Laboratory Data:  No results for input(s): "WBC", "HGB", "HCT", "PLT" in the last 72 hours.  No results for input(s): "NA", "K", "CL", "GLUCOSE", "BUN", "CALCIUM", "CREATININE" in the last 72 hours.  Invalid input(s): "CO3"   No results found for this or any previous visit (from the past 24 hour(s)). No results found for this or any previous visit (from the past 240 hour(s)).  Renal Function: No results for input(s): "CREATININE" in the last 168 hours. Estimated Creatinine Clearance: 83.1 mL/min (by C-G formula based on SCr of 1.18 mg/dL).  Radiologic Imaging: No results found.  Assessment:  Derek [name] is a 62 y.o. year old with a right ureteral stone  Plan:  R ESWL as planned  Donald Pore, MD 01/07/2023, 10:18 AM  [facility] Pager: [phone]

## 2023-01-08 ENCOUNTER — Encounter (HOSPITAL_BASED_OUTPATIENT_CLINIC_OR_DEPARTMENT_OTHER): Payer: Self-pay | Admitting: Urology

## 2023-01-11 DIAGNOSIS — U071 COVID-19: Secondary | ICD-10-CM | POA: Diagnosis not present

## 2023-01-11 DIAGNOSIS — R002 Palpitations: Secondary | ICD-10-CM | POA: Diagnosis not present

## 2023-01-11 DIAGNOSIS — F419 Anxiety disorder, unspecified: Secondary | ICD-10-CM | POA: Diagnosis not present

## 2023-01-11 DIAGNOSIS — J069 Acute upper respiratory infection, unspecified: Secondary | ICD-10-CM | POA: Diagnosis not present

## 2023-01-11 DIAGNOSIS — I10 Essential (primary) hypertension: Secondary | ICD-10-CM | POA: Diagnosis not present

## 2023-01-15 ENCOUNTER — Telehealth: Payer: Self-pay | Admitting: Psychiatry

## 2023-01-15 NOTE — Telephone Encounter (Signed)
Patient called in regarding a letter to be out of work. States that he hasn't been able to go to work in about and has had to use vacation for this. Explains that he and JC have discussed this. He would like to be out on STD for a couple of weeks from today 2/13 until around March 3. Please rtc call [phone]

## 2023-01-15 NOTE — Telephone Encounter (Signed)
Letter routed to admin box to be printed on letterhead. Please let pt know that letter should be ready tomorrow for pick up.

## 2023-01-15 NOTE — Telephone Encounter (Signed)
Please see message from patient.  I haven't called him, but if I need to call and get more information please let me know and I will be glad to.

## 2023-01-17 ENCOUNTER — Telehealth: Payer: Self-pay | Admitting: Psychiatry

## 2023-01-17 NOTE — Telephone Encounter (Signed)
STD Dis Forms received from [employer] for Pt. Given to nurse.

## 2023-01-25 ENCOUNTER — Telehealth: Payer: Self-pay | Admitting: Psychiatry

## 2023-01-25 NOTE — Telephone Encounter (Signed)
See message in regards to STD.

## 2023-01-25 NOTE — Telephone Encounter (Signed)
This is complete and given to [facility] for review and signature

## 2023-01-25 NOTE — Telephone Encounter (Signed)
Derek [name] called at 10:25 to check status of the STD form being sent to [employer].  He's concerned because this disability check is the only income in the house. Please let him know if you need anything from him.

## 2023-01-28 DIAGNOSIS — Z0289 Encounter for other administrative examinations: Secondary | ICD-10-CM

## 2023-01-29 NOTE — Telephone Encounter (Signed)
Faxed per request.

## 2023-01-30 ENCOUNTER — Telehealth: Payer: Self-pay | Admitting: Psychiatry

## 2023-01-30 NOTE — Telephone Encounter (Signed)
Pt called today at 9:44a to check on status of Short Term Disability Letter.  I let him know it was sent yesterday.  He said he is scheduled to go back to work on Monday, but he has been struggling the last few days and he wonders what would need to happen if he can't go back on Monday.  Pls call him to discuss.  Next appt 1/13

## 2023-01-30 NOTE — Telephone Encounter (Signed)
Agree that leave can be extended if needed. He has had adverse effects with multiple other medications in the past.

## 2023-02-01 ENCOUNTER — Encounter: Payer: Self-pay | Admitting: Psychiatry

## 2023-02-01 ENCOUNTER — Telehealth (INDEPENDENT_AMBULATORY_CARE_PROVIDER_SITE_OTHER): Payer: BC Managed Care – PPO | Admitting: Psychiatry

## 2023-02-01 DIAGNOSIS — F410 Panic disorder [episodic paroxysmal anxiety] without agoraphobia: Secondary | ICD-10-CM

## 2023-02-01 MED ORDER — ALPRAZOLAM 1 MG PO TABS: ORAL_TABLET | ORAL | 0 refills | Status: DC

## 2023-02-01 NOTE — Progress Notes (Signed)
Derek [name] [id number] Dec 02, 1961 62 y.o.  Virtual Visit via Video Note  I connected with pt @ on 02/01/23 at 11:00 AM EST by a video enabled telemedicine application and verified that I am speaking with the correct person using two identifiers.   I discussed the limitations of evaluation and management by telemedicine and the availability of in person appointments. The patient expressed understanding and agreed to proceed.  I discussed the assessment and treatment plan with the patient. The patient was provided an opportunity to ask questions and all were answered. The patient agreed with the plan and demonstrated an understanding of the instructions.   The patient was advised to call back or seek an in-person evaluation if the symptoms worsen or if the condition fails to improve as anticipated.  I provided 35 minutes of non-face-to-face time during this encounter.  The patient was located at home.  The provider was located at home.   Thayer Headings, PMHNP   Subjective:   Patient ID:  Derek [name] is a 62 y.o. (DOB May 21, 1961) male.  Chief Complaint:  Chief Complaint  Patient presents with   Panic Attack    HPI Derek [name] presents emergently for panic. He reports that he has had severe panic and anxiety over the last couple of days. He reports that about 3 weeks ago he experienced sharp pain and went to the ER, where he stayed all night. He reports that he was diagnosed with a kidney stone and surgery was scheduled for the following week. He reports that he tested positive before scheduled surgery. He reports that he was cleared for surgery and given Valium 10 mg pre-op. He reports that he began having severe anxiety and panic after surgery. He reports that he tried to return to work after surgery and then was not able to do so due to anxiety. He reports that he was able to push himself to go to work on a Monday and that he had severe anxiety with palpitations. He reports the next day  he had severe palpitations and was hyperventilating and had to have help walking out to his vehicle. He then contacted our office and was placed on medical leave. Scheduled return to work date is next Monday, 02/04/23. He reports, "I'm still not there" in terms of panic being controlled. He reports that he is continuing to have palpitations and shortness of breath. BP has been elevated (142/88, 128/98, 140/96 during some recent medical visits. HR is chronically low, ie. 49, 62, ...) He reports that he has had severe anxiety with brief outings and felt like he could not breathe when he tried to go to the grocery store. He reports that he has tried different strategies to try to improve anxiety, such as physical exertion and walking outside, and this has not been helpful. He reports poor sleep for a few nights and sleep has been better the last 1-2 nights. He reports that he is trying to maintain a consistent sleep/wake cycle with getting up at 5 am daily as usual to ease transition back to work. Denies depressed mood. Denies change in appetite. Denies SI.   Review of Systems:  Review of Systems  Constitutional:  Negative for diaphoresis.  Respiratory:  Positive for shortness of breath.   Cardiovascular:  Positive for palpitations.  Musculoskeletal:  Negative for gait problem.  Neurological:  Negative for tremors.  Psychiatric/Behavioral:         Please refer to HPI    Medications: I  have reviewed the patient's current medications.  Current Outpatient Medications  Medication Sig Dispense Refill   ELIQUIS 5 MG TABS tablet Take 5 mg by mouth 2 (two) times daily.  3   sertraline (ZOLOFT) 100 MG tablet TAKE 1/2 OR 1 TABLET BY MOUTH EVERY DAY 90 tablet 1   ALPRAZolam (XANAX) 1 MG tablet TAKE 1 TABLET FOUR TIMES DAILY FOR 3-5 DAYS, THEN TAKE 1 TABLET THREE TIMES DAILY FOR 3-5 DAYS, THEN RESUME 1 TABLET TWICE A DAY ASNEEDED 90 tablet 0   ondansetron (ZOFRAN) 4 MG tablet Take 1 tablet (4 mg total) by mouth  every 6 (six) hours. 12 tablet 0   No current facility-administered medications for this visit.    Medication Side Effects: None  Allergies: No Known Allergies  Past Medical History:  Diagnosis Date   Factor V Leiden ([place])    History of DVT (deep vein thrombosis)    History of panic attacks    Left nephrolithiasis    Low testosterone    Right ureteral stone     Family History  Problem Relation Age of Onset   Heart disease Paternal Grandmother        ? possible h/o heart attack   Anxiety disorder Mother    AAA (abdominal aortic aneurysm) Father    Heart attack Father 31    Social History   Socioeconomic History   Marital status: Single    Spouse name: Not on file   Number of children: Not on file   Years of education: Not on file   Highest education level: Not on file  Occupational History   Not on file  Tobacco Use   Smoking status: Former    Packs/day: 0.50    Years: 20.00    Total pack years: 10.00    Types: Cigarettes    Quit date: 04/23/2009    Years since quitting: 13.7   Smokeless tobacco: Current    Types: Snuff, Chew   Tobacco comments:    occasionally dips / chewtobacco. Quit ~2015.  Substance and Sexual Activity   Alcohol use: Yes    Alcohol/week: 0.0 standard drinks of alcohol    Comment: occasional - 1-2 beers/week   Drug use: No   Sexual activity: Not on file  Other Topics Concern   Not on file  Social History Narrative   Not on file   Social Determinants of Health   Financial Resource Strain: Not on file  Food Insecurity: Not on file  Transportation Needs: Not on file  Physical Activity: Not on file  Stress: Not on file  Social Connections: Not on file  Intimate Partner Violence: Not on file    Past Medical History, Surgical history, Social history, and Family history were reviewed and updated as appropriate.   Please see review of systems for further details on the patient's review from today.   Objective:   Physical Exam:   There were no vitals taken for this visit.  Physical Exam Neurological:     Mental Status: He is alert and oriented to person, place, and time.     Cranial Nerves: No dysarthria.  Psychiatric:        Attention and Perception: Attention and perception normal.        Mood and Affect: Mood is anxious.        Speech: Speech normal.        Behavior: Behavior is cooperative.        Thought Content: Thought content normal. Thought content  is not paranoid or delusional. Thought content does not include homicidal or suicidal ideation. Thought content does not include homicidal or suicidal plan.        Cognition and Memory: Cognition and memory normal.        Judgment: Judgment normal.     Comments: Insight intact Speech is more rapid compared to baseline     Lab Review:     Component Value Date/Time   NA 135 12/27/2022 2250   K 4.0 12/27/2022 2250   CL 106 12/27/2022 2250   CO2 22 12/27/2022 2250   GLUCOSE 115 (H) 12/27/2022 2250   BUN 16 12/27/2022 2250   CREATININE 1.18 12/27/2022 2250   CALCIUM 8.6 (L) 12/27/2022 2250   GFRNONAA >60 12/27/2022 2250   GFRAA >60 07/29/2018 1039       Component Value Date/Time   WBC 7.2 12/27/2022 2250   RBC 5.65 12/27/2022 2250   HGB 16.7 12/27/2022 2250   HCT 51.2 12/27/2022 2250   PLT 255 12/27/2022 2250   MCV 90.6 12/27/2022 2250   MCV 88.1 11/24/2014 1913   MCH 29.6 12/27/2022 2250   MCHC 32.6 12/27/2022 2250   RDW 13.1 12/27/2022 2250   LYMPHSABS 2.1 12/27/2022 2250   MONOABS 0.4 12/27/2022 2250   EOSABS 0.1 12/27/2022 2250   BASOSABS 0.1 12/27/2022 2250    No results found for: "POCLITH", "LITHIUM"   No results found for: "PHENYTOIN", "PHENOBARB", "VALPROATE", "CBMZ"   .res Assessment: Plan:   Pt seen for 35 minutes and time spent discussing recent symptoms and events leading up to onset of panic. Discussed that on rare occasions some people will experience an acute exacerbation of panic following surgery. He typically takes  Xanax 1 mg 1/2 tab TID and has increased to 1 tab TID as discussed with office. Recommended increase in Xanax to 1 mg four times daily for 3-5 days to stabilize acute anxiety/panic since this has been effective in the past several years ago when he had an acute exacerbation of panic s/s. Discussed that he may experience some sedation at higher dose of Xanax. Discussed decreasing Xanax after 3-5 days to 1 mg TID for 3-5 days, then 1 mg BID prn. Discussed that it may be helpful to divide doses to 4 times daily during taper to help prevent breakthrough anxiety since Xanax has a short half-life.  Will continue Sertraline for anxiety. He has had side effects with higher doses and been unable to tolerate other alternatives (SSRI's, hydroxyzine, Viibryd) in the past. Discussed that he is also not a good candidate for augmentation with alpha blockers or beta blockers due to chronically low HR.  Recommend extending leave through 02/18/23 with tentative return to work date of 02/19/23.  Pt to follow-up as needed based on response to treatment.  Patient advised to contact office with any questions, adverse effects, or acute worsening in signs and symptoms.  James was seen today for panic attack.  Diagnoses and all orders for this visit:  Panic disorder -     ALPRAZolam (XANAX) 1 MG tablet; TAKE 1 TABLET FOUR TIMES DAILY FOR 3-5 DAYS, THEN TAKE 1 TABLET THREE TIMES DAILY FOR 3-5 DAYS, THEN RESUME 1 TABLET TWICE A DAY ASNEEDED     Please see After Visit Summary for patient specific instructions.  Future Appointments  Date Time Provider [place]  12/16/2023  4:00 PM Thayer Headings, PMHNP CP-CP None    No orders of the defined types were placed in this encounter.     -------------------------------

## 2023-02-11 NOTE — Telephone Encounter (Signed)
Pt lvm that he would like to return to work. He needs a letter to say he can go back to work on Wednesday March 13,20204

## 2023-02-28 ENCOUNTER — Ambulatory Visit: Payer: BC Managed Care – PPO | Admitting: Psychiatry

## 2023-03-25 ENCOUNTER — Ambulatory Visit (INDEPENDENT_AMBULATORY_CARE_PROVIDER_SITE_OTHER): Payer: BC Managed Care – PPO | Admitting: Psychiatry

## 2023-03-25 ENCOUNTER — Encounter: Payer: Self-pay | Admitting: Psychiatry

## 2023-03-25 DIAGNOSIS — F410 Panic disorder [episodic paroxysmal anxiety] without agoraphobia: Secondary | ICD-10-CM | POA: Diagnosis not present

## 2023-03-25 MED ORDER — ALPRAZOLAM 1 MG PO TABS: ORAL_TABLET | ORAL | 1 refills | Status: DC

## 2023-03-25 MED ORDER — BUSPIRONE HCL 5 MG PO TABS: ORAL_TABLET | ORAL | 1 refills | Status: DC

## 2023-03-25 NOTE — Progress Notes (Unsigned)
Derek [name] [id number] 1961-04-21 62 y.o.  Subjective:   Patient ID:  Derek [name] is a 62 y.o. (DOB 05/17/1961) male.  Chief Complaint:  Chief Complaint  Patient presents with   Panic Attack    HPI Derek [name] presents to the office today for follow-up of panic and anxiety. He reports continued elevated panic since surgery.   He is seen emergently for panic. He reports that he has not been having panic daily.  He reports that he has anxiety and panic upon awakening.  He reports that he has been hyperventilating and having racing heart rate.   He reports that at times anxiety is keeping him from leaving home.   He reports work and crowds trigger his anxiety. He reports that panic symptoms are occurring daily and are lasting longer. He reports trying multiple changes to possibly relieve anxiety, to include dietary changes.   He reports that he took xanax 1/2 upon awakening and 1/2 tab before going into work. He reports that anxiety is better once he is gets focused on a task. He reports that his energy and motivation are low. He reports adequate concentration once he starts a task.   Sleeping well. He reports, "I don't eat a lot." Denies depressed mood. Denies financial stress. Denies SI.   Been at current company for 35 years.   Last filled 03/08/23.    Flowsheet Row Admission (Discharged) from 01/07/2023 in [facility] ED from 12/27/2022 in [facility] Emergency Department at [facility] ED from 11/03/2022 in [facility] at [facility] RISK CATEGORY No Risk No Risk No Risk        Review of Systems:  Review of Systems  He reports that sleep study was normal.    Medications: I have reviewed the patient's current medications.  Current Outpatient Medications  Medication Sig Dispense Refill   ALPRAZolam (XANAX) 1 MG tablet TAKE 1 TABLET FOUR TIMES DAILY FOR 3-5 DAYS, THEN TAKE 1 TABLET THREE TIMES DAILY FOR 3-5 DAYS, THEN RESUME 1 TABLET TWICE A DAY  ASNEEDED 90 tablet 0   ELIQUIS 5 MG TABS tablet Take 5 mg by mouth 2 (two) times daily.  3   sertraline (ZOLOFT) 100 MG tablet TAKE 1/2 OR 1 TABLET BY MOUTH EVERY DAY 90 tablet 1   No current facility-administered medications for this visit.    Medication Side Effects: None  Allergies: No Known Allergies  Past Medical History:  Diagnosis Date   Factor V Leiden    History of DVT (deep vein thrombosis)    History of panic attacks    Left nephrolithiasis    Low testosterone    Right ureteral stone     Past Medical History, Surgical history, Social history, and Family history were reviewed and updated as appropriate.   Please see review of systems for further details on the patient's review from today.   Objective:   Physical Exam:  There were no vitals taken for this visit.  Physical Exam  Lab Review:     Component Value Date/Time   NA 135 12/27/2022 2250   K 4.0 12/27/2022 2250   CL 106 12/27/2022 2250   CO2 22 12/27/2022 2250   GLUCOSE 115 (H) 12/27/2022 2250   BUN 16 12/27/2022 2250   CREATININE 1.18 12/27/2022 2250   CALCIUM 8.6 (L) 12/27/2022 2250   GFRNONAA >60 12/27/2022 2250   GFRAA >60 07/29/2018 1039       Component Value Date/Time  WBC 7.2 12/27/2022 2250   RBC 5.65 12/27/2022 2250   HGB 16.7 12/27/2022 2250   HCT 51.2 12/27/2022 2250   PLT 255 12/27/2022 2250   MCV 90.6 12/27/2022 2250   MCV 88.1 11/24/2014 1913   MCH 29.6 12/27/2022 2250   MCHC 32.6 12/27/2022 2250   RDW 13.1 12/27/2022 2250   LYMPHSABS 2.1 12/27/2022 2250   MONOABS 0.4 12/27/2022 2250   EOSABS 0.1 12/27/2022 2250   BASOSABS 0.1 12/27/2022 2250    No results found for: "POCLITH", "LITHIUM"   No results found for: "PHENYTOIN", "PHENOBARB", "VALPROATE", "CBMZ"   .res Assessment: Plan:    Derek [name] was seen today for panic attack.  Diagnoses and all orders for this visit:  Panic disorder     Please see After Visit Summary for patient specific instructions.  Future  Appointments  Date Time Provider Department Center  12/16/2023  4:00 PM Corie Chiquito, PMHNP CP-CP None    No orders of the defined types were placed in this encounter.   -------------------------------

## 2023-04-03 ENCOUNTER — Ambulatory Visit: Payer: BC Managed Care – PPO | Admitting: Psychiatry

## 2023-04-12 DIAGNOSIS — R0609 Other forms of dyspnea: Secondary | ICD-10-CM | POA: Diagnosis not present

## 2023-04-12 DIAGNOSIS — F419 Anxiety disorder, unspecified: Secondary | ICD-10-CM | POA: Diagnosis not present

## 2023-04-12 DIAGNOSIS — Z1331 Encounter for screening for depression: Secondary | ICD-10-CM | POA: Diagnosis not present

## 2023-04-12 DIAGNOSIS — D229 Melanocytic nevi, unspecified: Secondary | ICD-10-CM | POA: Diagnosis not present

## 2023-04-12 DIAGNOSIS — I10 Essential (primary) hypertension: Secondary | ICD-10-CM | POA: Diagnosis not present

## 2023-04-16 ENCOUNTER — Emergency Department (HOSPITAL_COMMUNITY)
Admission: EM | Admit: 2023-04-16 | Discharge: 2023-04-16 | Disposition: A | Discharge status: Home / Self Care | Payer: BC Managed Care – PPO | Attending: Emergency Medicine | Admitting: Emergency Medicine

## 2023-04-16 ENCOUNTER — Emergency Department (HOSPITAL_COMMUNITY): Payer: BC Managed Care – PPO

## 2023-04-16 ENCOUNTER — Other Ambulatory Visit: Payer: Self-pay

## 2023-04-16 DIAGNOSIS — Z79899 Other long term (current) drug therapy: Secondary | ICD-10-CM | POA: Diagnosis not present

## 2023-04-16 DIAGNOSIS — Z7901 Long term (current) use of anticoagulants: Secondary | ICD-10-CM | POA: Diagnosis not present

## 2023-04-16 DIAGNOSIS — I471 Supraventricular tachycardia, unspecified: Secondary | ICD-10-CM | POA: Diagnosis not present

## 2023-04-16 DIAGNOSIS — R000 Tachycardia, unspecified: Secondary | ICD-10-CM | POA: Diagnosis not present

## 2023-04-16 LAB — D-DIMER, QUANTITATIVE: D-Dimer, Quant: <0.27 ug/mL-FEU (ref 0.00–0.50)

## 2023-04-16 LAB — CBC
HCT: 53.5 % — ABNORMAL HIGH (ref 39.0–52.0)
Hemoglobin: 17.3 g/dL — ABNORMAL HIGH (ref 13.0–17.0)
MCH: 28.7 pg (ref 26.0–34.0)
MCHC: 32.3 g/dL (ref 30.0–36.0)
MCV: 88.7 fL (ref 80.0–100.0)
Platelets: 296 10*3/uL (ref 150–400)
RBC: 6.03 MIL/uL — ABNORMAL HIGH (ref 4.22–5.81)
RDW: 13.1 % (ref 11.5–15.5)
WBC: 13.1 10*3/uL — ABNORMAL HIGH (ref 4.0–10.5)
nRBC: 0 % (ref 0.0–0.2)

## 2023-04-16 LAB — TROPONIN I (HIGH SENSITIVITY): Troponin I (High Sensitivity): 6 ng/L (ref <18)

## 2023-04-16 LAB — BASIC METABOLIC PANEL
Anion gap: 11 (ref 5–15)
BUN: 21 mg/dL — ABNORMAL HIGH (ref 6–20)
CO2: 22 mmol/L (ref 22–32)
Calcium: 8.9 mg/dL (ref 8.9–10.3)
Chloride: 102 mmol/L (ref 98–111)
Creatinine, Ser: 1.48 mg/dL — ABNORMAL HIGH (ref 0.61–1.24)
GFR, Estimated: 54 mL/min — ABNORMAL LOW (ref >60)
Glucose, Bld: 115 mg/dL — ABNORMAL HIGH (ref 70–99)
Potassium: 3.5 mmol/L (ref 3.5–5.1)
Sodium: 135 mmol/L (ref 135–145)

## 2023-04-16 LAB — TSH: TSH: 3.052 u[IU]/mL (ref 0.350–4.500)

## 2023-04-16 LAB — MAGNESIUM: Magnesium: 2.2 mg/dL (ref 1.7–2.4)

## 2023-04-16 MED ORDER — ADENOSINE 6 MG/2ML IV SOLN
6.0000 mg | Freq: Once | INTRAVENOUS | Status: AC
  Administered 2023-04-16: 6 mg via INTRAVENOUS

## 2023-04-16 MED ORDER — METOPROLOL TARTRATE 25 MG PO TABS: 25.0000 mg | ORAL_TABLET | Freq: Two times a day (BID) | ORAL | 0 refills | Status: DC

## 2023-04-16 MED ORDER — METOPROLOL TARTRATE 25 MG PO TABS
25.0000 mg | ORAL_TABLET | Freq: Once | ORAL | Status: AC
  Administered 2023-04-16: 25 mg via ORAL
  Filled 2023-04-16: qty 1

## 2023-04-16 MED ORDER — SODIUM CHLORIDE 0.9 % IV BOLUS
1000.0000 mL | Freq: Once | INTRAVENOUS | Status: AC
  Administered 2023-04-16: 1000 mL via INTRAVENOUS

## 2023-04-16 MED ORDER — ADENOSINE 6 MG/2ML IV SOLN
INTRAVENOUS | Status: DC | PRN
  Administered 2023-04-16: 12 mg via INTRAVENOUS
  Administered 2023-04-16: 18 mg via INTRAVENOUS

## 2023-04-16 NOTE — ED Triage Notes (Signed)
Pt states that he was at work today and felt his heart start racing and having midsternal CP. Pt denies cardiac hx. In triage intial HR 220's and BP 76/67.

## 2023-04-16 NOTE — ED Provider Notes (Signed)
[place] EMERGENCY DEPARTMENT AT [facility] Provider Note   CSN: [id number] Arrival date & time: 04/16/23  1528     History  Chief Complaint  Patient presents with   SVT    Derek [name] is a 62 y.o. male.  Pt is a 62 yo male with pmhx significant for factor V Leiden and DVT (on Eliquis) and kidney stones.  Pt was at work today and getting ready to leave.  He felt his heart start to race right before 3 pm.  He started to drive home, but came here instead because he felt so bad.  He has never had anything like this in the past.       Home Medications Prior to Admission medications   Medication Sig Start Date End Date Taking? Authorizing Provider  metoprolol tartrate (LOPRESSOR) 25 MG tablet Take 1 tablet (25 mg total) by mouth 2 (two) times daily. 04/16/23  Yes Jacalyn Lefevre, MD  ALPRAZolam Prudy Feeler) 1 MG tablet TAKE 1/2-1 TAB PO TID PRN PANIC 03/25/23   Corie Chiquito, PMHNP  busPIRone (BUSPAR) 5 MG tablet Take 1/2 tab po BID for one week, then increase to 1 tab po BID for one week, then increase to 1.5 tabs po BID 03/25/23   Corie Chiquito, PMHNP  ELIQUIS 5 MG TABS tablet Take 5 mg by mouth 2 (two) times daily. 06/24/18   [provider]  sertraline (ZOLOFT) 100 MG tablet TAKE 1/2 OR 1 TABLET BY MOUTH EVERY DAY 12/13/22   Corie Chiquito, PMHNP      Allergies    Patient has no known allergies.    Review of Systems   Review of Systems  Cardiovascular:  Positive for palpitations.  All other systems reviewed and are negative.   Physical Exam Updated Vital Signs BP 113/86   Pulse 98   Temp 97.7 F (36.5 C)   Resp (!) 25   Ht 6' (1.829 m)   Wt 108.9 kg   SpO2 93%   BMI 32.55 kg/m  Physical Exam Vitals and nursing note reviewed.  Constitutional:      General: He is in acute distress.     Appearance: Normal appearance. He is obese.  HENT:     Head: Normocephalic and atraumatic.     Right Ear: External ear normal.     Left Ear: External ear  normal.     Nose: Nose normal.     Mouth/Throat:     Mouth: Mucous membranes are moist.     Pharynx: Oropharynx is clear.  Eyes:     Extraocular Movements: Extraocular movements intact.     Conjunctiva/sclera: Conjunctivae normal.     Pupils: Pupils are equal, round, and reactive to light.  Cardiovascular:     Rate and Rhythm: Regular rhythm. Tachycardia present.     Pulses: Normal pulses.     Heart sounds: Normal heart sounds.  Pulmonary:     Effort: Pulmonary effort is normal.     Breath sounds: Normal breath sounds.  Abdominal:     General: Abdomen is flat. Bowel sounds are normal.     Palpations: Abdomen is soft.  Musculoskeletal:        General: Normal range of motion.     Cervical back: Normal range of motion and neck supple.  Skin:    General: Skin is warm.     Capillary Refill: Capillary refill takes less than 2 seconds.  Neurological:     General: No focal deficit present.  Mental Status: He is alert and oriented to person, place, and time.  Psychiatric:        Mood and Affect: Mood normal.        Behavior: Behavior normal.     ED Results / Procedures / Treatments   Labs (all labs ordered are listed, but only abnormal results are displayed) Labs Reviewed  BASIC METABOLIC PANEL - Abnormal; Notable for the following components:      Result Value   Glucose, Bld 115 (*)    BUN 21 (*)    Creatinine, Ser 1.48 (*)    GFR, Estimated 54 (*)    All other components within normal limits  CBC - Abnormal; Notable for the following components:   WBC 13.1 (*)    RBC 6.03 (*)    Hemoglobin 17.3 (*)    HCT 53.5 (*)    All other components within normal limits  MAGNESIUM  D-DIMER, QUANTITATIVE  TSH  TROPONIN I (HIGH SENSITIVITY)  TROPONIN I (HIGH SENSITIVITY)    EKG EKG Interpretation  Date/Time:  Tuesday Apr 16 2023 15:57:28 EDT Ventricular Rate:  103 PR Interval:  164 QRS Duration: 86 QT Interval:  300 QTC Calculation: 393 R Axis:   83 Text  Interpretation: Sinus tachycardia Atrial premature complex Borderline right axis deviation Nonspecific repol abnormality, diffuse leads after adenosine Confirmed by Jacalyn Lefevre [phone]) on 04/16/2023 4:08:48 PM  Radiology DG Chest Port 1 View  Result Date: 04/16/2023 CLINICAL DATA:  Supraventricular tachycardia. EXAM: PORTABLE CHEST 1 VIEW COMPARISON:  None Available. FINDINGS: Low lung volumes accentuate the pulmonary vasculature and cardiomediastinal silhouette. No consolidation or pulmonary edema. No pleural effusion or pneumothorax. Visualized bones and upper abdomen are unremarkable. IMPRESSION: No evidence of acute cardiopulmonary disease. Electronically Signed   By: Orvan Falconer M.D.   On: 04/16/2023 16:12    Procedures .Cardioversion  Date/Time: 04/16/2023 4:12 PM  Performed by: Jacalyn Lefevre, MD Authorized by: Jacalyn Lefevre, MD   Consent:    Consent obtained:  Verbal   Consent given by:  Patient   Alternatives discussed:  No treatment Pre-procedure details:    Cardioversion basis:  Emergent   Rhythm:  Supraventricular tachycardia Patient sedated: No Attempt one:    Shock outcome:  Conversion to normal sinus rhythm Post-procedure details:    Patient status:  Awake   Patient tolerance of procedure:  Tolerated well, no immediate complications Comments:     Pt given 6 mg adenosine without change. Pt given 12 mg adenosine without change. Pt given 18 mg adenosine and SVT finally broke.      Medications Ordered in ED Medications  adenosine (ADENOCARD) 6 MG/2ML injection (18 mg Intravenous Given 04/16/23 1556)  sodium chloride 0.9 % bolus 1,000 mL (has no administration in time range)  metoprolol tartrate (LOPRESSOR) tablet 25 mg (has no administration in time range)  adenosine (ADENOCARD) 6 MG/2ML injection 6 mg (6 mg Intravenous Given 04/16/23 1547)    ED Course/ Medical Decision Making/ A&P                             Medical Decision Making Amount and/or  Complexity of Data Reviewed Labs: ordered. Radiology: ordered.  Risk Prescription drug management.   This patient presents to the ED for concern of palpitations, this involves an extensive number of treatment options, and is a complaint that carries with it a high risk of complications and morbidity.  The differential diagnosis includes svt, vt, afib,  st   Co morbidities that complicate the patient evaluation  actor V Leiden and DVT (on Eliquis) and kidney stones   Additional history obtained:  Additional history obtained from epic chart review  Lab Tests:  I Ordered, and personally interpreted labs.  The pertinent results include:  ddimer neg, cbc with wbc elevated at 13.1, hgb 17.3, trop nl, ddimer neg, bmp nl other than cr slightly bumped at 1.48   Imaging Studies ordered:  I ordered imaging studies including cxr  I independently visualized and interpreted imaging which showed  CXR: No evidence of acute cardiopulmonary disease.  I agree with the radiologist interpretation   Cardiac Monitoring:  The patient was maintained on a cardiac monitor.  I personally viewed and interpreted the cardiac monitored which showed an underlying rhythm of: SVT initially; now NSR   Medicines ordered and prescription drug management:  I ordered medication including adenosine  for SVT  Reevaluation of the patient after these medicines showed that the patient improved I have reviewed the patients home medicines and have made adjustments as needed   Critical Interventions:  adenosine   Consultations Obtained:  I requested consultation with the cardiologist (Dr. Bjorn Pippin),  and discussed lab and imaging findings as well as pertinent plan - he recommends metoprolol 25 mg bid.  He will have to office call to make an appointment.   Problem List / ED Course:  SVT:  the SVT finally broke with 18 mg adenosine.  Pt has been in NSR since then.  He feels much better.  He will be started on  metoprolol.  Return if worse.  F/u with cards.   Reevaluation:  After the interventions noted above, I reevaluated the patient and found that they have :improved   Social Determinants of Health:  Lives at home   Dispostion:  After consideration of the diagnostic results and the patients response to treatment, I feel that the patent would benefit from discharge with outpatient f/u.    CRITICAL CARE Performed by: Jacalyn Lefevre   Total critical care time: 30 minutes  Critical care time was exclusive of separately billable procedures and treating other patients.  Critical care was necessary to treat or prevent imminent or life-threatening deterioration.  Critical care was time spent personally by me on the following activities: development of treatment plan with patient and/or surrogate as well as nursing, discussions with consultants, evaluation of patient's response to treatment, examination of patient, obtaining history from patient or surrogate, ordering and performing treatments and interventions, ordering and review of laboratory studies, ordering and review of radiographic studies, pulse oximetry and re-evaluation of patient's condition.         Final Clinical Impression(s) / ED Diagnoses Final diagnoses:  SVT (supraventricular tachycardia)    Rx / DC Orders ED Discharge Orders          Ordered    metoprolol tartrate (LOPRESSOR) 25 MG tablet  2 times daily        04/16/23 1812              Jacalyn Lefevre, MD 04/16/23 1814

## 2023-04-16 NOTE — ED Triage Notes (Signed)
Pt drove self, CC: SVT.

## 2023-04-20 ENCOUNTER — Other Ambulatory Visit: Payer: Self-pay | Admitting: Psychiatry

## 2023-04-20 DIAGNOSIS — F410 Panic disorder [episodic paroxysmal anxiety] without agoraphobia: Secondary | ICD-10-CM

## 2023-04-21 NOTE — Progress Notes (Unsigned)
Cardiology Office Note:   Date:  04/22/2023  NAME:  Derek [name]    MRN: [id number] DOB:  [birth year]   PCP:  Garlan Fillers, MD  Cardiologist:  Dietrich Pates, MD  Electrophysiologist:  None   Referring MD: Garlan Fillers, MD   Chief Complaint  Patient presents with   Follow-up        History of Present Illness:   Derek [name] is a 62 y.o. male with a hx of SVT, HTN who is being seen today for the evaluation of SVT at the request of Garlan Fillers, MD. recently seen in the emergency room on 04/16/2023 for SVT.  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  Heart rate up to 230.  This did terminate with adenosine.  He reports he has had palpitations for 10 years.  He did wear a monitor in 2019.  Brief SVT was seen.  Nothing sustained.  He reports he does have stress.  He was having symptoms of palpitations daily.  They would last seconds.  The episode on 04/16/2023 lasted up to 1 hour.  In the ER troponin was negative.  He does have sleep apnea.  His blood pressure is slightly elevated today.  He was started on metoprolol.  Since starting metoprolol succinate he has noticed no further episodes but does report fatigue.  We discussed taking this medication at night.  He did have an echo in 2019 that was normal.  Needs repeat.  Normal thyroid.  He reports no chest pains or trouble breathing.  He is single.  He has 1 child.  No grandchildren.  He continues to work as Personnel officer.  No major issues.  CT PE study negative for coronary calcium 5 years ago.  He does use smokeless tobacco.  He reports 1 beer nightly.  No drug use.  No regular exercise but denies any symptoms other than when he is having rapid heartbeat sensation.  HGB 17.3 T chol 192, HDL 34, LDL 116, TG 210 TSH 3.05  CT PE 05/05/2018 -> No coronary calcium  Problem List SVT  -Dx 04/16/2023 in ER Factor V Leiden  OSA HTN CKD IIIa  Past Medical History: Past Medical History:  Diagnosis Date   Factor V Leiden  (HCC)    History of DVT (deep vein thrombosis)    History of panic attacks    Left nephrolithiasis    Low testosterone    Right ureteral stone     Past Surgical History: Past Surgical History:  Procedure Laterality Date   APPENDECTOMY     CYSTO/  RIGHT RETROGRADE PYELOGRAM/  RIGHT URETERAL STENT PLACEMENT  04/23/2014   CYSTOSCOPY W/ URETERAL STENT PLACEMENT Right 04/23/2014   Procedure: CYSTOSCOPY  RIGHT RETROGRADE PYELOGRAM/RIGHT URETERAL STENT PLACEMENT;  Surgeon: Magdalene Molly, MD;  Location: [facility];  Service: Urology;  Laterality: Right;   CYSTOSCOPY WITH RETROGRADE PYELOGRAM, URETEROSCOPY AND STENT PLACEMENT Right 05/03/2014   Procedure: CYSTOSCOPY WITH RETROGRADE PYELOGRAM, URETEROSCOPY AND STENT EXCHANGE;  Surgeon: Magdalene Molly, MD;  Location: [facility];  Service: Urology;  Laterality: Right;   EXTRACORPOREAL SHOCK WAVE LITHOTRIPSY Right 01/07/2023   Procedure: RIGHT EXTRACORPOREAL SHOCK WAVE LITHOTRIPSY (ESWL);  Surgeon: Despina Arias, MD;  Location: [facility];  Service: Urology;  Laterality: Right;  75 MINUTES NEEDED FOR CASE   HOLMIUM LASER APPLICATION Right 05/03/2014   Procedure: HOLMIUM LASER APPLICATION;  Surgeon: Magdalene Molly, MD;  Location: [facility];  Service: Urology;  Laterality: Right;   ROTATOR CUFF REPAIR      Current Medications: Current Meds  Medication Sig   ALPRAZolam (XANAX) 1 MG tablet TAKE 1/2-1 TAB PO TID PRN PANIC   busPIRone (BUSPAR) 5 MG tablet TAKE 1.5 TABS BY MOUTH TWICE A DAY   ELIQUIS 5 MG TABS tablet Take 5 mg by mouth 2 (two) times daily.   metoprolol succinate (TOPROL XL) 25 MG 24 hr tablet Take 1 tablet (25 mg total) by mouth daily.   sertraline (ZOLOFT) 100 MG tablet TAKE 1/2 OR 1 TABLET BY MOUTH EVERY DAY   [DISCONTINUED] metoprolol tartrate (LOPRESSOR) 25 MG tablet Take 1 tablet (25 mg total) by mouth 2 (two) times daily.     Allergies:    Patient has  no known allergies.   Social History: Social History   Socioeconomic History   Marital status: Single    Spouse name: Not on file   Number of children: 1   Years of education: Not on file   Highest education level: Not on file  Occupational History   Occupation: Personnel officer  Tobacco Use   Smoking status: Former    Packs/day: 0.50    Years: 20.00    Additional pack years: 0.00    Total pack years: 10.00    Types: Cigarettes    Quit date: 04/23/2009    Years since quitting: 14.0   Smokeless tobacco: Current    Types: Snuff, Chew   Tobacco comments:    occasionally dips / chewtobacco. Quit ~2015.  Substance and Sexual Activity   Alcohol use: Yes    Alcohol/week: 0.0 standard drinks of alcohol    Comment: occasional - 1-2 beers/week   Drug use: No   Sexual activity: Not on file  Other Topics Concern   Not on file  Social History Narrative   Not on file   Social Determinants of Health   Financial Resource Strain: Not on file  Food Insecurity: Not on file  Transportation Needs: Not on file  Physical Activity: Not on file  Stress: Not on file  Social Connections: Not on file     Family History: The patient's family history includes AAA (abdominal aortic aneurysm) in his father; Anxiety disorder in his mother; Heart attack (age of onset: 2) in his father; Heart disease in his paternal grandmother.  ROS:   All other ROS reviewed and negative. Pertinent positives noted in the HPI.     EKGs/Labs/Other Studies Reviewed:   The following studies were personally reviewed by me today:  EKG:  EKG is ordered today.  The ekg ordered today demonstrates sinus bradycardia heart rate 48, no acute ischemic changes or evidence of infarction, and was personally reviewed by me.   Recent Labs: 04/16/2023: BUN 21; Creatinine, Ser 1.48; Hemoglobin 17.3; Magnesium 2.2; Platelets 296; Potassium 3.5; Sodium 135; TSH 3.052   Recent Lipid Panel No results found for: "CHOL", "TRIG", "HDL",  "CHOLHDL", "VLDL", "LDLCALC", "LDLDIRECT"  Physical Exam:   VS:  BP (!) 135/95   Pulse (!) 49   Ht 6' (1.829 m)   Wt 248 lb (112.5 kg)   SpO2 98%   BMI 33.63 kg/m    Wt Readings from Last 3 Encounters:  04/22/23 248 lb (112.5 kg)  04/16/23 240 lb (108.9 kg)  01/07/23 244 lb (110.7 kg)    General: Well nourished, well developed, in no acute distress Head: Atraumatic, normal size  Eyes: PEERLA, EOMI  Neck: Supple, no JVD Endocrine: No  thryomegaly Cardiac: Normal S1, S2; RRR; no murmurs, rubs, or gallops Lungs: Clear to auscultation bilaterally, no wheezing, rhonchi or rales  Abd: Soft, nontender, no hepatomegaly  Ext: No edema, pulses 2+ Musculoskeletal: No deformities, BUE and BLE strength normal and equal Skin: Warm and dry, no rashes   Neuro: Alert and oriented to person, place, time, and situation, CNII-XII grossly intact, no focal deficits  Psych: Normal mood and affect   ASSESSMENT:   KALIN NEMITZ is a 62 y.o. male who presents for the following: 1. SVT (supraventricular tachycardia)   2. Palpitations     PLAN:   1. SVT (supraventricular tachycardia) 2. Palpitations -Recent admission to the ER with SVT.  Looks like AV nodal reentrant tachycardia.  Heart rate up to 230.  EKG in office sinus bradycardia.  He does report some fatigue with the beta-blocker but no further episodes.  We will transition to metoprolol succinate at night.  We will see if this helps.  If he continues to have symptoms he will need to take this as needed.  Needs a repeat echo.  Troponin was negative.  He passed a stress test.  No coronary calcium seen.  I will refer him to electrophysiology for ablation.  Given how fast this was I believe he merits ablation consideration.  Hopefully we can get him in soon.  He will see me back in 6 months.  We did discuss vagal maneuvers should this happen again.  We also discussed stopping smokeless tobacco use.  He does have factor V Leiden deficiency with  thromboembolic events.  He is on Eliquis.  Electrophysiology should be mindful of this regarding any ablation procedure.  Disposition: Return in about 6 months (around 10/23/2023).  Medication Adjustments/Labs and Tests Ordered: Current medicines are reviewed at length with the patient today.  Concerns regarding medicines are outlined above.  Orders Placed This Encounter  Procedures   Ambulatory referral to Cardiac Electrophysiology   EKG 12-Lead   ECHOCARDIOGRAM COMPLETE   Meds ordered this encounter  Medications   metoprolol succinate (TOPROL XL) 25 MG 24 hr tablet    Sig: Take 1 tablet (25 mg total) by mouth daily.    Dispense:  90 tablet    Refill:  3    Patient Instructions  Medication Instructions:  STOP Metoprolol Tartrate START Metoprolol Succinate 25 mg at night   *If you need a refill on your cardiac medications before your next appointment, please call your pharmacy*  Testing/Procedures:  Echocardiogram - Your physician has requested that you have an echocardiogram. Echocardiography is a painless test that uses sound waves to create images of your heart. It provides your doctor with information about the size and shape of your heart and how well your heart's chambers and valves are working. This procedure takes approximately one hour. There are no restrictions for this procedure.     Follow-Up: At [facility], you and your health needs are our priority.  As part of our continuing mission to provide you with exceptional heart care, we have created designated Provider Care Teams.  These Care Teams include your primary Cardiologist (physician) and Advanced Practice Providers (APPs -  Physician Assistants and Nurse Practitioners) who all work together to provide you with the care you need, when you need it.  We recommend signing up for the patient portal called "MyChart".  Sign up information is provided on this After Visit Summary.  MyChart is used to connect  with patients for Virtual Visits (Telemedicine).  Patients are able to view lab/test results, encounter notes, upcoming appointments, etc.  Non-urgent messages can be sent to your provider as well.   To learn more about what you can do with MyChart, go to [email or web address].    Your next appointment:   6 month(s)  Provider:   Lennie Odor, MD   Other Instructions Referral to EP- they will contact you to schedule.      Signed, Lenna Gilford. Flora Lipps, MD, [facility]  [facility]  [address] [place], [place] [address] [phone]  04/22/2023 8:39 AM

## 2023-04-21 NOTE — Telephone Encounter (Signed)
Please call to schedule an earlier appt. Was due this month but canceled and scheduled for January.

## 2023-04-22 ENCOUNTER — Ambulatory Visit: Payer: BC Managed Care – PPO | Attending: Cardiovascular Disease | Admitting: Cardiovascular Disease

## 2023-04-22 ENCOUNTER — Encounter: Payer: Self-pay | Admitting: Cardiovascular Disease

## 2023-04-22 ENCOUNTER — Ambulatory Visit: Payer: BC Managed Care – PPO | Admitting: Psychiatry

## 2023-04-22 VITALS — BP 135/95 | HR 49 | Ht 72.0 in | Wt 248.0 lb

## 2023-04-22 DIAGNOSIS — R002 Palpitations: Secondary | ICD-10-CM | POA: Diagnosis not present

## 2023-04-22 DIAGNOSIS — I471 Supraventricular tachycardia, unspecified: Secondary | ICD-10-CM

## 2023-04-22 MED ORDER — METOPROLOL SUCCINATE ER 25 MG PO TB24: 25.0000 mg | ORAL_TABLET | Freq: Every day | ORAL | 3 refills | Status: DC

## 2023-04-22 NOTE — Patient Instructions (Addendum)
Medication Instructions:  STOP Metoprolol Tartrate START Metoprolol Succinate 25 mg at night   *If you need a refill on your cardiac medications before your next appointment, please call your pharmacy*  Testing/Procedures:  Echocardiogram - Your physician has requested that you have an echocardiogram. Echocardiography is a painless test that uses sound waves to create images of your heart. It provides your doctor with information about the size and shape of your heart and how well your heart's chambers and valves are working. This procedure takes approximately one hour. There are no restrictions for this procedure.     Follow-Up: At [facility], you and your health needs are our priority.  As part of our continuing mission to provide you with exceptional heart care, we have created designated Provider Care Teams.  These Care Teams include your primary Cardiologist (physician) and Advanced Practice Providers (APPs -  Physician Assistants and Nurse Practitioners) who all work together to provide you with the care you need, when you need it.  We recommend signing up for the patient portal called "MyChart".  Sign up information is provided on this After Visit Summary.  MyChart is used to connect with patients for Virtual Visits (Telemedicine).  Patients are able to view lab/test results, encounter notes, upcoming appointments, etc.  Non-urgent messages can be sent to your provider as well.   To learn more about what you can do with MyChart, go to [email or web address].    Your next appointment:   6 month(s)  Provider:   Lennie Odor, MD   Other Instructions Referral to EP- they will contact you to schedule.

## 2023-04-22 NOTE — Telephone Encounter (Signed)
Pt scheduled for 6/24

## 2023-04-23 ENCOUNTER — Telehealth: Payer: Self-pay

## 2023-04-23 DIAGNOSIS — I10 Essential (primary) hypertension: Secondary | ICD-10-CM | POA: Diagnosis not present

## 2023-04-23 DIAGNOSIS — I471 Supraventricular tachycardia, unspecified: Secondary | ICD-10-CM | POA: Diagnosis not present

## 2023-04-23 DIAGNOSIS — Z87891 Personal history of nicotine dependence: Secondary | ICD-10-CM | POA: Diagnosis not present

## 2023-04-23 DIAGNOSIS — F419 Anxiety disorder, unspecified: Secondary | ICD-10-CM | POA: Diagnosis not present

## 2023-04-23 NOTE — Telephone Encounter (Signed)
Left message for patient to call back  

## 2023-04-23 NOTE — Telephone Encounter (Signed)
-----   Message from Lanier Prude, MD sent at 04/22/2023  9:58 PM EDT ----- Regarding: RE: SVT Referral Carly, Can you get him in with me in the next 2-3 weeks to discuss catheter ablation? Thanks! Steffanie Dunn      ----- Message ----- From: Sande Rives, MD Sent: 04/22/2023   8:26 AM EDT To: Darene Lamer, LPN; Lanier Prude, MD; # Subject: SVT Referral                                   Seen in ER for SVT.  Looks like AVNRT.  Rates up into 230.  Responded to adenosine.  On a beta-blocker with no further palpitations but fatigue.  I think he just needs an ablation.  Can we get him in quickly?  -Wes

## 2023-04-25 NOTE — Telephone Encounter (Signed)
Patient called to confirm he will be at the appointment on 6/21 at 9:00 am.

## 2023-04-25 NOTE — Telephone Encounter (Signed)
Spoke with the patient and offered him a sooner appointment to be seen by Dr. Lalla Brothers. Patient agreeable.

## 2023-04-29 NOTE — Progress Notes (Unsigned)
  Electrophysiology Office Note:    Date:  04/30/2023   ID:  Derek [name], DOB 1961/05/01, MRN [id number]  [facility] Cardiologist:  Dietrich Pates, MD  [facility] Electrophysiologist:  Lanier Prude, MD   Referring MD: Sande Rives, *   Chief Complaint: SVT  History of Present Illness:    Derek [name] is a 62 y.o. male who I am seeing today for an evaluation of supraventricular tachycardia at the request of Dr. Flora Lipps.  The patient was seen by Dr. Flora Lipps Apr 22, 2023.  He has a history of hypertension.  The patient visited the emergency department Apr 16, 2023 with a narrow complex tachycardia that terminated with adenosine.  He reports on and off palpitations for at least 10 years.  He works as an Personnel officer.  His medical history also includes history of DVT, factor V Leiden mutation.  He has been prescribed metoprolol for his SVT but reports fatigue with this medication.        Their past medical, social and family history was reveiwed.   ROS:   Please see the history of present illness.    All other systems reviewed and are negative.  EKGs/Labs/Other Studies Reviewed:    The following studies were reviewed today:  May 14 EKG shows narrow complex tachycardia  Apr 22, 2023 EKG shows sinus rhythm/sinus bradycardia with no preexcitation  2019 Biotel monitor reviewed and shows no SVT  2019 echo showed normal LV function without significant valvular abnormalities  EKG:  The ekg ordered today demonstrates sinus rhythm. No preexcitation    Physical Exam:    VS:  BP (!) 120/92   Pulse 64   Ht 6' (1.829 m)   Wt 256 lb 12.8 oz (116.5 kg)   SpO2 98%   BMI 34.83 kg/m     Wt Readings from Last 3 Encounters:  04/30/23 256 lb 12.8 oz (116.5 kg)  04/22/23 248 lb (112.5 kg)  04/16/23 240 lb (108.9 kg)     GEN:  Well nourished, well developed in no acute distress CARDIAC: RRR, no murmurs, rubs, gallops RESPIRATORY:  Clear to auscultation without  rales, wheezing or rhonchi       ASSESSMENT AND PLAN:    1. SVT (supraventricular tachycardia)     #SVT Symptomatic. Adenosine sensitive.  Therapeutic strategies for supraventricular tachycardia including medicine and ablation were discussed in detail with the patient today. Risk, benefits, and alternatives to EP study and radiofrequency ablation were also discussed in detail today. These risks include but are not limited to stroke, bleeding, vascular damage, tamponade, perforation, damage to the heart and other structures, AV block requiring pacemaker, worsening renal function, and death. The patient understands these risk and wishes to proceed.  We will therefore proceed with catheter ablation at the next available time.  #Anxiety Managed by his PCP and psychiatry. I wonder if some of his previous episodes of anxiety could have been SVT episodes.       Signed, Rossie Muskrat. Lalla Brothers, MD, [facility], [facility] 04/30/2023 8:24 PM    Electrophysiology [facility]

## 2023-04-30 ENCOUNTER — Ambulatory Visit: Payer: BC Managed Care – PPO | Attending: Cardiology | Admitting: Cardiology

## 2023-04-30 ENCOUNTER — Encounter: Payer: Self-pay | Admitting: Cardiology

## 2023-04-30 VITALS — BP 120/92 | HR 64 | Ht 72.0 in | Wt 256.8 lb

## 2023-04-30 DIAGNOSIS — F419 Anxiety disorder, unspecified: Secondary | ICD-10-CM | POA: Diagnosis not present

## 2023-04-30 DIAGNOSIS — I471 Supraventricular tachycardia, unspecified: Secondary | ICD-10-CM

## 2023-04-30 NOTE — Patient Instructions (Signed)
Medication Instructions:  Your physician recommends that you continue on your current medications as directed. Please refer to the Current Medication list given to you today.  *If you need a refill on your cardiac medications before your next appointment, please call your pharmacy*  Lab Work: BMET and CBC prior to ablation  Testing/Procedures: Your physician has recommended that you have an ablation. Catheter ablation is a medical procedure used to treat some cardiac arrhythmias (irregular heartbeats). During catheter ablation, a long, thin, flexible tube is put into a blood vessel in your groin (upper thigh), or neck. This tube is called an ablation catheter. It is then guided to your heart through the blood vessel. Radio frequency waves destroy small areas of heart tissue where abnormal heartbeats may cause an arrhythmia to start. Please see the instruction sheet given to you today.  Follow-Up: At [facility], you and your health needs are our priority.  As part of our continuing mission to provide you with exceptional heart care, we have created designated Provider Care Teams.  These Care Teams include your primary Cardiologist (physician) and Advanced Practice Providers (APPs -  Physician Assistants and Nurse Practitioners) who all work together to provide you with the care you need, when you need it.  Your next appointment:   We will call you to schedule your follow up appointment.

## 2023-05-05 ENCOUNTER — Other Ambulatory Visit: Payer: Self-pay | Admitting: Psychiatry

## 2023-05-05 DIAGNOSIS — F410 Panic disorder [episodic paroxysmal anxiety] without agoraphobia: Secondary | ICD-10-CM

## 2023-05-06 ENCOUNTER — Other Ambulatory Visit: Payer: Self-pay | Admitting: Psychiatry

## 2023-05-06 DIAGNOSIS — F410 Panic disorder [episodic paroxysmal anxiety] without agoraphobia: Secondary | ICD-10-CM

## 2023-05-07 MED ORDER — BUSPIRONE HCL 5 MG PO TABS
ORAL_TABLET | ORAL | 0 refills | Status: DC
Start: 2023-05-07 — End: 2023-05-27

## 2023-05-07 NOTE — Addendum Note (Signed)
Addended by: Karin Lieu T on: 05/07/2023 08:09 PM   Modules accepted: Orders

## 2023-05-08 ENCOUNTER — Other Ambulatory Visit: Payer: Self-pay | Admitting: Internal Medicine

## 2023-05-08 DIAGNOSIS — Z87891 Personal history of nicotine dependence: Secondary | ICD-10-CM

## 2023-05-08 DIAGNOSIS — L821 Other seborrheic keratosis: Secondary | ICD-10-CM | POA: Diagnosis not present

## 2023-05-08 DIAGNOSIS — D225 Melanocytic nevi of trunk: Secondary | ICD-10-CM | POA: Diagnosis not present

## 2023-05-08 DIAGNOSIS — D485 Neoplasm of uncertain behavior of skin: Secondary | ICD-10-CM | POA: Diagnosis not present

## 2023-05-08 DIAGNOSIS — L239 Allergic contact dermatitis, unspecified cause: Secondary | ICD-10-CM | POA: Diagnosis not present

## 2023-05-08 DIAGNOSIS — D492 Neoplasm of unspecified behavior of bone, soft tissue, and skin: Secondary | ICD-10-CM | POA: Diagnosis not present

## 2023-05-08 DIAGNOSIS — L304 Erythema intertrigo: Secondary | ICD-10-CM | POA: Diagnosis not present

## 2023-05-13 ENCOUNTER — Telehealth: Payer: Self-pay | Admitting: Cardiology

## 2023-05-13 MED ORDER — METOPROLOL TARTRATE 50 MG PO TABS: 50.0000 mg | ORAL_TABLET | Freq: Two times a day (BID) | ORAL | 3 refills | Status: DC

## 2023-05-13 NOTE — Telephone Encounter (Signed)
Pt c/o medication issue:  1. Name of Medication:   2. How are you currently taking this medication (dosage and times per day)?    3. Are you having a reaction (difficulty breathing--STAT)? no  4. What is your medication issue? Patient calling in about a medication that is not listed under his current medication. States he received it in the hospital and wanting to know if he can increase the dosage. Please advise

## 2023-05-13 NOTE — Telephone Encounter (Signed)
Left message for patient to call back  

## 2023-05-13 NOTE — Telephone Encounter (Signed)
Spoke with the patient and he is aware of recommendations from Dr. Flora Lipps.

## 2023-05-13 NOTE — Telephone Encounter (Signed)
Patient is returning call. Please advise? 

## 2023-05-13 NOTE — Telephone Encounter (Signed)
Spoke with the patient who states that when he went to the ER for SVT they put him on metoprolol tartrate 25 mg twice daily. He went to see Dr. Flora Lipps who switched him to metoprolol succinate 25 mg daily. He states that the long acting medication made him feel bad. He states that he was fatigued and had more frequent palpitations. He switched back to the metoprolol tartrate 25 mg twice daily on his own. He states that he is feels much better on this, however he has noticed more frequent palpitations recently. He reports BP and HR have been normal but did not have any readings to provide. He would like to know if he can increase his dose. He is scheduled for an SVT ablation on 07/01.

## 2023-05-15 ENCOUNTER — Telehealth: Payer: Self-pay | Admitting: Cardiology

## 2023-05-15 NOTE — Telephone Encounter (Signed)
Left message for patient to call back  

## 2023-05-15 NOTE — Telephone Encounter (Signed)
Patient is calling stating he is having a hard time staying at work with everything that is going on. He is requesting a note excusing him from work until after his ablation.   Please advise.

## 2023-05-16 ENCOUNTER — Telehealth: Payer: Self-pay | Admitting: Cardiology

## 2023-05-16 NOTE — Telephone Encounter (Signed)
Symptoms sound concerning. Recommend patient go to ER or urgent care for assessment

## 2023-05-16 NOTE — Telephone Encounter (Signed)
Returned call to patient. Patient states he has been feeling poorly and is unable to work. 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  Patient is requesting a work note to be excused from work until after his ablation procedure scheduled for 06/03/23.  From another phone note from today: Cheree Ditto, [facility]   05/16/23 12:47 PM Note Symptoms sound concerning. Recommend patient go to ER or urgent care for assessment      Asencion Gowda, LPN  to Cv Div Pharmd  Lanier Prude, MD  Frutoso Schatz, RN    05/16/23 12:02 PM Please advise   Asencion Gowda, LPN   03/11/80 19:14 PM Note Spoke to the patient, he is currently prescribe Metoprolol tartrate  50 mg BID. Pt  c/o of fatigue, feeling "low winded" HR 40's-50's. Pt stated today while at work he began feeling very weak and lightheaded, his co-workers had to assist him into his car.  MD and nurse are currently not in the office, will forward to Pharm D for advise.           YO   05/16/23 11:10 AM Lorie Phenix routed this conversation to [facility] Triage  Lorie Phenix YO   05/16/23 11:10 AM Note Pt c/o medication issue:   1. Name of Medication: metoprolol tartrate (LOPRESSOR) 50 MG tablet    2. How are you currently taking this medication (dosage and times per day)?    Take 1 tablet (50 mg total) by mouth 2 (two) times daily.      3. Are you having a reaction (difficulty breathing--STAT)? no   4. What is your medication issue? Patient states medication is making him feel lightheaded, nauseous, and like someone is sitting on his chest.  He just doesn't feel good.  He wants to know if something else can be prescribed for him.        Will forward to Dr. Lovena Neighbours nurse to follow-up with patient when she returns to office tomorrow. Patient requests a call early morning.

## 2023-05-16 NOTE — Telephone Encounter (Signed)
Pt c/o medication issue:  1. Name of Medication: metoprolol tartrate (LOPRESSOR) 50 MG tablet   2. How are you currently taking this medication (dosage and times per day)?    Take 1 tablet (50 mg total) by mouth 2 (two) times daily.    3. Are you having a reaction (difficulty breathing--STAT)? no  4. What is your medication issue? Patient states medication is making him feel lightheaded, nauseous, and like someone is sitting on his chest.  He just doesn't feel good.  He wants to know if something else can be prescribed for him.

## 2023-05-16 NOTE — Telephone Encounter (Signed)
Patient returned call

## 2023-05-16 NOTE — Telephone Encounter (Signed)
Spoke to the patient, he is currently prescribe Metoprolol tartrate  50 mg BID. Pt  c/o of fatigue, feeling "low winded" HR 40's-50's. Pt stated today while at work he began feeling very weak and lightheaded, his co-workers had to assist him into his car.  MD and nurse are currently not in the office, will forward to Pharm D for advise.

## 2023-05-16 NOTE — Telephone Encounter (Signed)
Left message for the pt to call the office.

## 2023-05-20 NOTE — Telephone Encounter (Signed)
Pt calling back for an update. Informed him the message was sent to Dr. Lalla Brothers and he will be back on Wednesday. Pt asked if someone else can write it. Informed him I was sure if this was possible but will forward to nurse to ask.

## 2023-05-20 NOTE — Telephone Encounter (Signed)
Returned call to patient. Informed patient again that Dr. Lalla Brothers is out of the office until Wednesday 6/19. The message about him needing an excuse letter to be out of work until after his ablation procedure has been relayed to Dr. Lalla Brothers and his nurse.  Patient states the Lopressor is causing him to feel dizzy and nauseated. He has taken it with food and also tried taking only 25mg  BID instead of 50mg , with no improvement. He notes that he missed a day of the medication and started to feel better. Denies any palpitations.  Patient states he took a dose of the Lopressor this morning but will not be taking anymore. He states he will take it if he has palpitations, but it makes him feel "too bad" to take regularly. He is concerned due to the nature of this job and the risk of fall/injury involved when he takes Lopressor.  He states he has had to miss several days of work due to side effects of this medication and he cannot miss anymore.   Will forward to Dr. Lovena Neighbours nurse to follow-up with patient when she returns to the office.

## 2023-05-21 ENCOUNTER — Ambulatory Visit (HOSPITAL_COMMUNITY): Payer: BC Managed Care – PPO | Attending: Cardiovascular Disease

## 2023-05-21 ENCOUNTER — Ambulatory Visit: Payer: BC Managed Care – PPO

## 2023-05-21 DIAGNOSIS — I471 Supraventricular tachycardia, unspecified: Secondary | ICD-10-CM | POA: Diagnosis not present

## 2023-05-21 LAB — BASIC METABOLIC PANEL
BUN/Creatinine Ratio: 16 (ref 10–24)
eGFR: 74 mL/min/{1.73_m2} (ref >59)

## 2023-05-21 LAB — ECHOCARDIOGRAM COMPLETE
Area-P 1/2: 4.93 cm2
S' Lateral: 2.8 cm

## 2023-05-21 LAB — CBC WITH DIFFERENTIAL/PLATELET

## 2023-05-22 ENCOUNTER — Telehealth: Payer: Self-pay | Admitting: Cardiology

## 2023-05-22 LAB — CBC WITH DIFFERENTIAL/PLATELET
Basophils Absolute: 0 10*3/uL (ref 0.0–0.2)
Basos: 1 %
EOS (ABSOLUTE): 0.2 10*3/uL (ref 0.0–0.4)
Eos: 2 %
Hematocrit: 53.1 % — ABNORMAL HIGH (ref 37.5–51.0)
Hemoglobin: 17.4 g/dL (ref 13.0–17.7)
Immature Grans (Abs): 0 10*3/uL (ref 0.0–0.1)
Immature Granulocytes: 0 %
Lymphocytes Absolute: 2.7 10*3/uL (ref 0.7–3.1)
MCH: 29 pg (ref 26.6–33.0)
MCHC: 32.8 g/dL (ref 31.5–35.7)
MCV: 88 fL (ref 79–97)
Monocytes Absolute: 0.5 10*3/uL (ref 0.1–0.9)
Neutrophils Absolute: 4 10*3/uL (ref 1.4–7.0)
Neutrophils: 54 %
Platelets: 252 10*3/uL (ref 150–450)
RBC: 6.01 x10E6/uL — ABNORMAL HIGH (ref 4.14–5.80)
WBC: 7.5 10*3/uL (ref 3.4–10.8)

## 2023-05-22 LAB — BASIC METABOLIC PANEL
BUN: 18 mg/dL (ref 8–27)
CO2: 24 mmol/L (ref 20–29)
Calcium: 9.9 mg/dL (ref 8.6–10.2)
Chloride: 101 mmol/L (ref 96–106)
Creatinine, Ser: 1.14 mg/dL (ref 0.76–1.27)
Glucose: 85 mg/dL (ref 70–99)
Potassium: 4.7 mmol/L (ref 3.5–5.2)
Sodium: 141 mmol/L (ref 134–144)

## 2023-05-22 NOTE — Telephone Encounter (Signed)
Patient called back again requesting a work note to excuse him from work from now until a week after his ablation. I talked with the patient who states that he did stop taking Lopressor. He states that since stopping he has felt a little bit better. He continues to have palpitations and fatigue. 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 He has an appointment with [name], [name] tomorrow to discuss symptoms and concerns.

## 2023-05-22 NOTE — Telephone Encounter (Signed)
Patient stated he will need to get a work note releasing him from working for the period 6/14-7/8 as he will be having an upcoming ablation.  Patient wants to come by and pick up note as soon as possible.

## 2023-05-22 NOTE — Telephone Encounter (Signed)
See previous phone note. Patient has been scheduled to see [name], [name] tomorrow 6/19.

## 2023-05-22 NOTE — Telephone Encounter (Signed)
Patient cancelled his appointment tomorrow and then later called back to try and reschedule. Spot was already taken so he was put on for next week.   Called patient who states that he thought he could work through his symptoms but then today he became very fatigued again and was concerned so called back. Patient is very concerned about all of the work he has been missing because of his symptoms.  Advised patient that Dr. Lalla Brothers has recommended that he cut his metoprolol in half as he seemed to tolerate this dose much better. He can also take an extra 1/2 tablet as needed. Patient verbalized understanding.   Patient is also aware of ER precautions.

## 2023-05-22 NOTE — Telephone Encounter (Signed)
Patient called back stating he does want to be seen as soon as possible and wants options for his next steps.  Patient has visit scheduled on 6/24.

## 2023-05-23 ENCOUNTER — Ambulatory Visit: Payer: BC Managed Care – PPO | Admitting: Physician Assistant

## 2023-05-23 NOTE — Progress Notes (Deleted)
Cardiology Office Note:    Date:  05/23/2023   ID:  RANDON MUSSON, DOB 01/07/1961, MRN [id number]  PCP:  Garlan Fillers, MD   [facility] Providers Cardiologist:  Dietrich Pates, MD Electrophysiologist:  Lanier Prude, MD { Click to update primary MD,subspecialty MD or APP then REFRESH:1}    Referring MD: Garlan Fillers, MD   Chief Complaint: ***  History of Present Illness:    Derek [name] is a *** 62 y.o. male with a hx of SVT, DVT, factor V Leiden mutation on chronic anticoagulation, anxiety, CKD, HTN, OSA, tobacco abuse  Seen in cardiology clinic 09/2018 by Tereso Newcomer, PA following ED visit on 08/01/2018 for fatigue, shortness of breath, and rapid palpitations at which time patient was evaluated by Dr. Tenny Craw.  History of    Past Medical History:  Diagnosis Date   Factor V Leiden (HCC)    History of DVT (deep vein thrombosis)    History of panic attacks    Left nephrolithiasis    Low testosterone    Right ureteral stone     Past Surgical History:  Procedure Laterality Date   APPENDECTOMY     CYSTO/  RIGHT RETROGRADE PYELOGRAM/  RIGHT URETERAL STENT PLACEMENT  04/23/2014   CYSTOSCOPY W/ URETERAL STENT PLACEMENT Right 04/23/2014   Procedure: CYSTOSCOPY  RIGHT RETROGRADE PYELOGRAM/RIGHT URETERAL STENT PLACEMENT;  Surgeon: Magdalene Molly, MD;  Location: [facility];  Service: Urology;  Laterality: Right;   CYSTOSCOPY WITH RETROGRADE PYELOGRAM, URETEROSCOPY AND STENT PLACEMENT Right 05/03/2014   Procedure: CYSTOSCOPY WITH RETROGRADE PYELOGRAM, URETEROSCOPY AND STENT EXCHANGE;  Surgeon: Magdalene Molly, MD;  Location: [facility];  Service: Urology;  Laterality: Right;   EXTRACORPOREAL SHOCK WAVE LITHOTRIPSY Right 01/07/2023   Procedure: RIGHT EXTRACORPOREAL SHOCK WAVE LITHOTRIPSY (ESWL);  Surgeon: Despina Arias, MD;  Location: [facility];  Service: Urology;  Laterality: Right;  75 MINUTES NEEDED FOR CASE    HOLMIUM LASER APPLICATION Right 05/03/2014   Procedure: HOLMIUM LASER APPLICATION;  Surgeon: Magdalene Molly, MD;  Location: [facility];  Service: Urology;  Laterality: Right;   ROTATOR CUFF REPAIR      Current Medications: No outpatient medications have been marked as taking for the 05/27/23 encounter (Appointment) with Lissa Hoard, Zachary George, NP.     Allergies:   Patient has no known allergies.   Social History   Socioeconomic History   Marital status: Single    Spouse name: Not on file   Number of children: 1   Years of education: Not on file   Highest education level: Not on file  Occupational History   Occupation: Personnel officer  Tobacco Use   Smoking status: Former    Packs/day: 0.50    Years: 20.00    Additional pack years: 0.00    Total pack years: 10.00    Types: Cigarettes    Quit date: 04/23/2009    Years since quitting: 14.0   Smokeless tobacco: Current    Types: Snuff, Chew   Tobacco comments:    occasionally dips / chewtobacco. Quit ~2015.  Substance and Sexual Activity   Alcohol use: Yes    Alcohol/week: 0.0 standard drinks of alcohol    Comment: occasional - 1-2 beers/week   Drug use: No   Sexual activity: Not on file  Other Topics Concern   Not on file  Social History Narrative   Not on file   Social Determinants of Health  Financial Resource Strain: Not on file  Food Insecurity: Not on file  Transportation Needs: Not on file  Physical Activity: Not on file  Stress: Not on file  Social Connections: Not on file     Family History: The patient's ***family history includes AAA (abdominal aortic aneurysm) in his father; Anxiety disorder in his mother; Heart attack (age of onset: 8) in his father; Heart disease in his paternal grandmother.  ROS:   Please see the history of present illness.    *** All other systems reviewed and are negative.  Labs/Other Studies Reviewed:    The following studies were reviewed today: ***  Recent  Labs: 04/16/2023: Magnesium 2.2; TSH 3.052 05/21/2023: BUN 18; Creatinine, Ser 1.14; Hemoglobin 17.4; Platelets 252; Potassium 4.7; Sodium 141  Recent Lipid Panel No results found for: "CHOL", "TRIG", "HDL", "CHOLHDL", "VLDL", "LDLCALC", "LDLDIRECT"   Risk Assessment/Calculations:   {Does this patient have ATRIAL FIBRILLATION?:[phone]}       Physical Exam:    VS:  There were no vitals taken for this visit.    Wt Readings from Last 3 Encounters:  04/30/23 256 lb 12.8 oz (116.5 kg)  04/22/23 248 lb (112.5 kg)  04/16/23 240 lb (108.9 kg)     GEN: *** Well nourished, well developed in no acute distress HEENT: Normal NECK: No JVD; No carotid bruits CARDIAC: ***RRR, no murmurs, rubs, gallops RESPIRATORY:  Clear to auscultation without rales, wheezing or rhonchi  ABDOMEN: Soft, non-tender, non-distended MUSCULOSKELETAL:  No edema; No deformity. *** pedal pulses, ***bilaterally SKIN: Warm and dry NEUROLOGIC:  Alert and oriented x 3 PSYCHIATRIC:  Normal affect   EKG:  EKG is *** ordered today.  The ekg ordered today demonstrates ***  No BP recorded.  {Refresh Note OR Click here to enter BP  :1}***    Diagnoses:    No diagnosis found. Assessment and Plan:     ***          {Are you ordering a CV Procedure (e.g. stress test, cath, DCCV, TEE, etc)?   Press F2        :[id number]}   Disposition:  Medication Adjustments/Labs and Tests Ordered: Current medicines are reviewed at length with the patient today.  Concerns regarding medicines are outlined above.  No orders of the defined types were placed in this encounter.  No orders of the defined types were placed in this encounter.   There are no Patient Instructions on file for this visit.   Signed, Levi Aland, NP  05/23/2023 8:37 AM    [facility]

## 2023-05-24 ENCOUNTER — Institutional Professional Consult (permissible substitution): Payer: BC Managed Care – PPO | Admitting: Cardiology

## 2023-05-27 ENCOUNTER — Encounter: Payer: Self-pay | Admitting: Psychiatry

## 2023-05-27 ENCOUNTER — Ambulatory Visit: Payer: BC Managed Care – PPO | Admitting: Nurse Practitioner

## 2023-05-27 ENCOUNTER — Ambulatory Visit (INDEPENDENT_AMBULATORY_CARE_PROVIDER_SITE_OTHER): Payer: BC Managed Care – PPO | Admitting: Psychiatry

## 2023-05-27 DIAGNOSIS — F410 Panic disorder [episodic paroxysmal anxiety] without agoraphobia: Secondary | ICD-10-CM

## 2023-05-27 MED ORDER — ALPRAZOLAM 1 MG PO TABS
ORAL_TABLET | ORAL | 5 refills | Status: DC
Start: 2023-06-02 — End: 2023-11-21

## 2023-05-27 MED ORDER — SERTRALINE HCL 100 MG PO TABS
ORAL_TABLET | ORAL | 1 refills | Status: DC
Start: 2023-05-27 — End: 2023-10-27

## 2023-05-27 NOTE — Progress Notes (Signed)
Derek [name] [id number] September 27, 1961 62 y.o.  Subjective:   Patient ID:  Derek [name] is a 62 y.o. (DOB [birth year]) male.  Chief Complaint:  Chief Complaint  Patient presents with   Panic Attack    HPI Orlondo B Amini presents to the office today for follow-up of panic. He reports that he had episode of increased HR that was unrelieved with Xanax. He reports that this started at 2:45 pm and then he went to the ER. SVT resolved with Adenosine and he was prescribed metoprolol. He reports that he has had difficulty tolerating Metoprolol and has had to use vacation time due to medication side effects (he reports feeling like something is sitting on his chest). He reports that if he does not take metoprolol he experiences tachycardia. He reports that Metoprolol tartrate was changed to Metoprolol Succinate. He reports that Metoprolol.   He reports, "I have no idea anymore" what is SVT versus panic attacks. He reports that in retrospect, SVT has likely been triggering panic. Sleeping well. He reports that energy is low with Metoprolol.   He reports typically taking Xanax 1 mg up to 2 tabs daily.   Xanax last filled 05/05/23.      Flowsheet Row ED from 04/16/2023 in [facility] Emergency Department at [facility] Admission (Discharged) from 01/07/2023 in [facility] ED from 12/27/2022 in [facility] Emergency Department at [facility]  C-SSRS RISK CATEGORY No Risk No Risk No Risk        Review of Systems:  Review of Systems  Cardiovascular:  Positive for palpitations.  Musculoskeletal:  Negative for gait problem.  Neurological:  Negative for tremors.  Psychiatric/Behavioral:         Please refer to HPI    Medications: I have reviewed the patient's current medications.  Current Outpatient Medications  Medication Sig Dispense Refill   ELIQUIS 5 MG TABS tablet Take 5 mg by mouth 2 (two) times daily.  3   metoprolol tartrate (LOPRESSOR) 50 MG tablet Take 1 tablet (50 mg total) by  mouth 2 (two) times daily. 180 tablet 3   [START ON 06/02/2023] ALPRAZolam (XANAX) 1 MG tablet TAKE 1/2-1 TAB PO TID PRN PANIC 90 tablet 5   sertraline (ZOLOFT) 100 MG tablet TAKE 1/2 OR 1 TABLET BY MOUTH EVERY DAY 90 tablet 1   No current facility-administered medications for this visit.    Medication Side Effects: Other: Reports several side effects with Metoprolol  Allergies: No Known Allergies  Past Medical History:  Diagnosis Date   Factor V Leiden (HCC)    History of DVT (deep vein thrombosis)    History of panic attacks    Left nephrolithiasis    Low testosterone    Right ureteral stone     Past Medical History, Surgical history, Social history, and Family history were reviewed and updated as appropriate.   Please see review of systems for further details on the patient's review from today.   Objective:   Physical Exam:  There were no vitals taken for this visit.  Physical Exam Constitutional:      General: He is not in acute distress. Musculoskeletal:        General: No deformity.  Neurological:     Mental Status: He is alert and oriented to person, place, and time.     Coordination: Coordination normal.  Psychiatric:        Attention and Perception: Attention and perception normal. He does not perceive auditory or visual hallucinations.  Mood and Affect: Mood is not depressed. Affect is not labile, blunt, angry or inappropriate.        Speech: Speech normal.        Behavior: Behavior normal.        Thought Content: Thought content normal. Thought content is not paranoid or delusional. Thought content does not include homicidal or suicidal ideation. Thought content does not include homicidal or suicidal plan.        Cognition and Memory: Cognition and memory normal.        Judgment: Judgment normal.     Comments: Insight intact Anxious in response to SVT     Lab Review:     Component Value Date/Time   NA 141 05/21/2023 0908   K 4.7 05/21/2023 0908    CL 101 05/21/2023 0908   CO2 24 05/21/2023 0908   GLUCOSE 85 05/21/2023 0908   GLUCOSE 115 (H) 04/16/2023 1605   BUN 18 05/21/2023 0908   CREATININE 1.14 05/21/2023 0908   CALCIUM 9.9 05/21/2023 0908   GFRNONAA 54 (L) 04/16/2023 1605   GFRAA >60 07/29/2018 1039       Component Value Date/Time   WBC 7.5 05/21/2023 0908   WBC 13.1 (H) 04/16/2023 1605   RBC 6.01 (H) 05/21/2023 0908   RBC 6.03 (H) 04/16/2023 1605   HGB 17.4 05/21/2023 0908   HCT 53.1 (H) 05/21/2023 0908   PLT 252 05/21/2023 0908   MCV 88 05/21/2023 0908   MCH 29.0 05/21/2023 0908   MCH 28.7 04/16/2023 1605   MCHC 32.8 05/21/2023 0908   MCHC 32.3 04/16/2023 1605   RDW 14.2 05/21/2023 0908   LYMPHSABS 2.7 05/21/2023 0908   MONOABS 0.4 12/27/2022 2250   EOSABS 0.2 05/21/2023 0908   BASOSABS 0.0 05/21/2023 0908    No results found for: "POCLITH", "LITHIUM"   No results found for: "PHENYTOIN", "PHENOBARB", "VALPROATE", "CBMZ"   .res Assessment: Plan:    I spent 33 minutes dedicated to the care of this patient on the date of this  encounter to include pre-visit review of records, face-to-face time with the patient discussing SVT and the effect that this has likely had on anxiety, intermittent FMLA, ordering of medication, and post visit documentation. Discussed that SVT has likely triggered panic symptoms and/or been interpreted as panic at times. Discussed that panic symptoms may improve once SVT is controlled.  Recommend intermittent FMLA for panic if not already approved. Recommend intermittent leave of 1-3 days in duration with 2-3 episodes per month.  Will continue Sertraline for panic.  Continue Alprazolam 1 mg 1/2-1 tab po TID prn panic.  Pt to follow-up in 3 months or sooner if clinically indicated.  Patient advised to contact office with any questions, adverse effects, or acute worsening in signs and symptoms.    [name] was seen today for panic attack.  Diagnoses and all orders for this  visit:  Panic disorder -     ALPRAZolam (XANAX) 1 MG tablet; TAKE 1/2-1 TAB PO TID PRN PANIC -     sertraline (ZOLOFT) 100 MG tablet; TAKE 1/2 OR 1 TABLET BY MOUTH EVERY DAY     Please see After Visit Summary for patient specific instructions.  Future Appointments  Date Time Provider Department Center  08/19/2023  3:45 PM Corie Chiquito, PMHNP [facility] None  10/23/2023  3:20 PM O'Neal, Ronnald Ramp, MD [facility] None  12/16/2023  4:00 PM Corie Chiquito, PMHNP [facility] None    No orders of the defined types were placed in  this encounter.   -------------------------------

## 2023-05-31 ENCOUNTER — Telehealth: Payer: Self-pay | Admitting: Cardiology

## 2023-05-31 NOTE — Telephone Encounter (Signed)
Pt would like a callback regarding upcoming Ablation on 7/1. Pt states he needs to know what time to arrive and any other instructions that he may  need. Please advise

## 2023-05-31 NOTE — Pre-Procedure Instructions (Signed)
Instructed patient on the following items: Arrival time 0830 Nothing to eat or drink after midnight No meds AM of procedure Responsible person to drive you home and stay with you for 24 hrs  Have you missed any doses of anti-coagulant Eliquis- takes twice a day, hasn't missed any doses.  Don't take Monday morning dose

## 2023-06-03 ENCOUNTER — Other Ambulatory Visit: Payer: Self-pay

## 2023-06-03 ENCOUNTER — Ambulatory Visit (HOSPITAL_COMMUNITY)
Admission: RE | Admit: 2023-06-03 | Discharge: 2023-06-03 | Disposition: A | Discharge status: Home / Self Care | Payer: BC Managed Care – PPO | Source: Ambulatory Visit | Attending: Cardiology | Admitting: Cardiology

## 2023-06-03 ENCOUNTER — Other Ambulatory Visit (HOSPITAL_COMMUNITY): Payer: Self-pay

## 2023-06-03 ENCOUNTER — Ambulatory Visit (HOSPITAL_COMMUNITY): Payer: BC Managed Care – PPO | Admitting: Anesthesiology

## 2023-06-03 ENCOUNTER — Encounter (HOSPITAL_COMMUNITY)
Admission: RE | Disposition: A | Discharge status: Home / Self Care | Payer: Self-pay | Source: Ambulatory Visit | Attending: Cardiology

## 2023-06-03 ENCOUNTER — Other Ambulatory Visit: Payer: Self-pay | Admitting: Physician Assistant

## 2023-06-03 DIAGNOSIS — I4892 Unspecified atrial flutter: Secondary | ICD-10-CM | POA: Insufficient documentation

## 2023-06-03 DIAGNOSIS — Z79899 Other long term (current) drug therapy: Secondary | ICD-10-CM | POA: Diagnosis not present

## 2023-06-03 DIAGNOSIS — I10 Essential (primary) hypertension: Secondary | ICD-10-CM | POA: Diagnosis not present

## 2023-06-03 DIAGNOSIS — I4891 Unspecified atrial fibrillation: Secondary | ICD-10-CM | POA: Insufficient documentation

## 2023-06-03 DIAGNOSIS — I480 Paroxysmal atrial fibrillation: Secondary | ICD-10-CM

## 2023-06-03 DIAGNOSIS — Z86718 Personal history of other venous thrombosis and embolism: Secondary | ICD-10-CM | POA: Diagnosis not present

## 2023-06-03 DIAGNOSIS — I471 Supraventricular tachycardia, unspecified: Secondary | ICD-10-CM | POA: Diagnosis not present

## 2023-06-03 DIAGNOSIS — D6851 Activated protein C resistance: Secondary | ICD-10-CM | POA: Insufficient documentation

## 2023-06-03 DIAGNOSIS — F419 Anxiety disorder, unspecified: Secondary | ICD-10-CM | POA: Diagnosis not present

## 2023-06-03 DIAGNOSIS — Z87891 Personal history of nicotine dependence: Secondary | ICD-10-CM | POA: Insufficient documentation

## 2023-06-03 HISTORY — PX: SVT ABLATION: EP1225

## 2023-06-03 SURGERY — SVT ABLATION
Anesthesia: General

## 2023-06-03 MED ORDER — SODIUM CHLORIDE 0.9 % IV SOLN: INTRAVENOUS | Status: DC

## 2023-06-03 MED ORDER — APIXABAN 5 MG PO TABS
5.0000 mg | ORAL_TABLET | Freq: Two times a day (BID) | ORAL | Status: DC
  Administered 2023-06-03: 5 mg via ORAL
  Filled 2023-06-03 (×2): qty 1

## 2023-06-03 MED ORDER — HEPARIN SODIUM (PORCINE) 1000 UNIT/ML IJ SOLN
INTRAMUSCULAR | Status: DC | PRN
  Administered 2023-06-03: 1000 [IU] via INTRAVENOUS

## 2023-06-03 MED ORDER — BUPIVACAINE HCL (PF) 0.25 % IJ SOLN
INTRAMUSCULAR | Status: DC | PRN
  Administered 2023-06-03: 35 mL

## 2023-06-03 MED ORDER — SODIUM CHLORIDE 0.9% FLUSH: 3.0000 mL | Freq: Two times a day (BID) | INTRAVENOUS | Status: DC

## 2023-06-03 MED ORDER — HEPARIN (PORCINE) IN NACL 1000-0.9 UT/500ML-% IV SOLN
INTRAVENOUS | Status: DC | PRN
  Administered 2023-06-03 (×3): 500 mL

## 2023-06-03 MED ORDER — FLECAINIDE ACETATE 50 MG PO TABS
50.0000 mg | ORAL_TABLET | Freq: Two times a day (BID) | ORAL | Status: DC
  Administered 2023-06-03: 50 mg via ORAL
  Filled 2023-06-03: qty 1

## 2023-06-03 MED ORDER — ISOPROTERENOL HCL 0.2 MG/ML IJ SOLN
INTRAVENOUS | Status: DC | PRN
  Administered 2023-06-03: 2 ug/min via INTRAVENOUS

## 2023-06-03 MED ORDER — METOPROLOL SUCCINATE ER 25 MG PO TB24: 25.0000 mg | ORAL_TABLET | Freq: Two times a day (BID) | ORAL | Status: DC

## 2023-06-03 MED ORDER — MIDAZOLAM HCL 2 MG/2ML IJ SOLN
INTRAMUSCULAR | Status: DC | PRN
  Administered 2023-06-03: 2 mg via INTRAVENOUS

## 2023-06-03 MED ORDER — PROPOFOL 500 MG/50ML IV EMUL
INTRAVENOUS | Status: DC | PRN
  Administered 2023-06-03: 50 ug/kg/min via INTRAVENOUS

## 2023-06-03 MED ORDER — ACETAMINOPHEN 325 MG PO TABS: 650.0000 mg | ORAL_TABLET | ORAL | Status: DC | PRN

## 2023-06-03 MED ORDER — SODIUM CHLORIDE 0.9 % IV SOLN: 250.0000 mL | INTRAVENOUS | Status: DC | PRN

## 2023-06-03 MED ORDER — SODIUM CHLORIDE 0.9% FLUSH: 3.0000 mL | INTRAVENOUS | Status: DC | PRN

## 2023-06-03 MED ORDER — FLECAINIDE ACETATE 50 MG PO TABS
50.0000 mg | ORAL_TABLET | Freq: Two times a day (BID) | ORAL | 5 refills | Status: DC
  Filled 2023-06-03: qty 60, 30d supply, fill #0

## 2023-06-03 MED ORDER — PHENYLEPHRINE 80 MCG/ML (10ML) SYRINGE FOR IV PUSH (FOR BLOOD PRESSURE SUPPORT)
PREFILLED_SYRINGE | INTRAVENOUS | Status: DC | PRN
  Administered 2023-06-03: 35 ug via INTRAVENOUS

## 2023-06-03 MED ORDER — BUPIVACAINE HCL (PF) 0.25 % IJ SOLN
INTRAMUSCULAR | Status: AC
  Filled 2023-06-03: qty 60

## 2023-06-03 MED ORDER — HEPARIN SODIUM (PORCINE) 1000 UNIT/ML IJ SOLN
INTRAMUSCULAR | Status: AC
  Filled 2023-06-03: qty 10

## 2023-06-03 MED ORDER — FENTANYL CITRATE (PF) 100 MCG/2ML IJ SOLN
INTRAMUSCULAR | Status: DC | PRN
  Administered 2023-06-03: 25 ug via INTRAVENOUS

## 2023-06-03 MED ORDER — ISOPROTERENOL HCL 0.2 MG/ML IJ SOLN
INTRAMUSCULAR | Status: AC
  Filled 2023-06-03: qty 5

## 2023-06-03 MED ORDER — DILTIAZEM HCL ER COATED BEADS 120 MG PO CP24
120.0000 mg | ORAL_CAPSULE | Freq: Every day | ORAL | Status: DC
  Administered 2023-06-03: 120 mg via ORAL
  Filled 2023-06-03: qty 1

## 2023-06-03 MED ORDER — DILTIAZEM HCL ER COATED BEADS 120 MG PO CP24
120.0000 mg | ORAL_CAPSULE | Freq: Every day | ORAL | 11 refills | Status: DC
  Filled 2023-06-03: qty 30, 30d supply, fill #0

## 2023-06-03 MED ORDER — PHENYLEPHRINE HCL-NACL 20-0.9 MG/250ML-% IV SOLN
INTRAVENOUS | Status: DC | PRN
  Administered 2023-06-03: 50 ug/min via INTRAVENOUS

## 2023-06-03 MED ORDER — PROPOFOL 10 MG/ML IV BOLUS
INTRAVENOUS | Status: DC | PRN
  Administered 2023-06-03: 15 mg via INTRAVENOUS

## 2023-06-03 MED ORDER — ONDANSETRON HCL 4 MG/2ML IJ SOLN: 4.0000 mg | Freq: Four times a day (QID) | INTRAMUSCULAR | Status: DC | PRN

## 2023-06-03 MED ORDER — EPHEDRINE SULFATE-NACL 50-0.9 MG/10ML-% IV SOSY
PREFILLED_SYRINGE | INTRAVENOUS | Status: DC | PRN
  Administered 2023-06-03: 10 mg via INTRAVENOUS
  Administered 2023-06-03: 5 mg via INTRAVENOUS

## 2023-06-03 SURGICAL SUPPLY — 15 items
CATH CRD2 QUAD 6FR REP (CATHETERS) IMPLANT
CATH DECANAV D CURVE (CATHETERS) IMPLANT
CATH JOSEPHSON QUAD-ALLRED 6FR (CATHETERS) IMPLANT
CATH SMTCH THERMOCOOL SF FJ (CATHETERS) IMPLANT
CLOSURE PERCLOSE PROSTYLE (VASCULAR PRODUCTS) IMPLANT
MAT PREVALON FULL STRYKER (MISCELLANEOUS) IMPLANT
PACK EP LATEX FREE (CUSTOM PROCEDURE TRAY) ×1
PACK EP LF (CUSTOM PROCEDURE TRAY) ×1 IMPLANT
PAD DEFIB RADIO PHYSIO CONN (PAD) ×1 IMPLANT
PATCH CARTO3 (PAD) IMPLANT
SHEATH CARTO VIZIGO SM CVD (SHEATH) IMPLANT
SHEATH PINNACLE 7F 10CM (SHEATH) IMPLANT
SHEATH PINNACLE 8F 10CM (SHEATH) IMPLANT
SHEATH PROBE COVER 6X72 (BAG) IMPLANT
TUBING SMART ABLATE COOLFLOW (TUBING) IMPLANT

## 2023-06-03 NOTE — Telephone Encounter (Signed)
MyChart message sent with Instructions

## 2023-06-03 NOTE — Anesthesia Procedure Notes (Signed)
Procedure Name: MAC Date/Time: 06/03/2023 9:35 AM  Performed by: Dorie Rank, CRNAPre-anesthesia Checklist: Patient identified, Emergency Drugs available, Suction available, Patient being monitored and Timeout performed Patient Re-evaluated:Patient Re-evaluated prior to induction Oxygen Delivery Method: Nasal cannula Preoxygenation: Pre-oxygenation with 100% oxygen Induction Type: IV induction Placement Confirmation: breath sounds checked- equal and bilateral and positive ETCO2 Dental Injury: Teeth and Oropharynx as per pre-operative assessment

## 2023-06-03 NOTE — H&P (Signed)
  Electrophysiology Office Note:     Date:  06/03/2023    ID:  Derek [name], DOB Dec 11, 1960, MRN [id number]   [facility] Cardiologist:  Dietrich Pates, MD  [facility] Electrophysiologist:  Lanier Prude, MD    Referring MD: Sande Rives, *    Chief Complaint: SVT   History of Present Illness:     Derek [name] is a 62 y.o. male who I am seeing today for an evaluation of supraventricular tachycardia at the request of Dr. Flora Lipps.  The patient was seen by Dr. Flora Lipps Apr 22, 2023.  He has a history of hypertension.  The patient visited the emergency department Apr 16, 2023 with a narrow complex tachycardia that terminated with adenosine.  He reports on and off palpitations for at least 10 years.  He works as an Personnel officer.   His medical history also includes history of DVT, factor V Leiden mutation.   He has been prescribed metoprolol for his SVT but reports fatigue with this medication.      Presents for EP study and possible ablation.     Objective  Their past medical, social and family history was reveiwed.     ROS:   Please see the history of present illness.    All other systems reviewed and are negative.   EKGs/Labs/Other Studies Reviewed:     The following studies were reviewed today:   May 14 EKG shows narrow complex tachycardia   Apr 22, 2023 EKG shows sinus rhythm/sinus bradycardia with no preexcitation   2019 Biotel monitor reviewed and shows no SVT   2019 echo showed normal LV function without significant valvular abnormalities   EKG:  The ekg ordered today demonstrates sinus rhythm. No preexcitation       Physical Exam:     VS:  BP 120/92   Pulse 64   Ht 6' (1.829 m)   Wt 256 lb 12.8 oz (116.5 kg)   SpO2 98%   BMI 34.83 kg/m         Wt Readings from Last 3 Encounters:  04/30/23 256 lb 12.8 oz (116.5 kg)  04/22/23 248 lb (112.5 kg)  04/16/23 240 lb (108.9 kg)      GEN:  Well nourished, well developed in no acute  distress CARDIAC: RRR, no murmurs, rubs, gallops RESPIRATORY:  Clear to auscultation without rales, wheezing or rhonchi          Assessment ASSESSMENT AND PLAN:     1. SVT (supraventricular tachycardia)       #SVT Symptomatic. Adenosine sensitive.   Therapeutic strategies for supraventricular tachycardia including medicine and ablation were discussed in detail with the patient today. Risk, benefits, and alternatives to EP study and radiofrequency ablation were also discussed in detail today. These risks include but are not limited to stroke, bleeding, vascular damage, tamponade, perforation, damage to the heart and other structures, AV block requiring pacemaker, worsening renal function, and death. The patient understands these risk and wishes to proceed.  We will therefore proceed with catheter ablation at the next available time.   #Anxiety Managed by his PCP and psychiatry. I wonder if some of his previous episodes of anxiety could have been SVT episodes.     Presents for EP study and possible ablation. Procedure reviewed.         Signed, Rossie Muskrat. Lalla Brothers, MD, [facility], [facility] 06/03/2023 Electrophysiology [facility]

## 2023-06-03 NOTE — Discharge Instructions (Signed)

## 2023-06-03 NOTE — Transfer of Care (Signed)
Immediate Anesthesia Transfer of Care Note  Patient: Derek [name]  Procedure(s) Performed: SVT ABLATION  Patient Location: PACU  Anesthesia Type:General  Level of Consciousness: awake, alert , and oriented  Airway & Oxygen Therapy: Patient connected to nasal cannula oxygen  Post-op Assessment: Post -op Vital signs reviewed and stable  Post vital signs: stable  Last Vitals:  Vitals Value Taken Time  BP    Temp    Pulse    Resp    SpO2      Last Pain:  Vitals:   06/03/23 0845  TempSrc: Temporal         Complications: There were no known notable events for this encounter.

## 2023-06-03 NOTE — Progress Notes (Signed)
Took over patient care at 1430. Patient ambulated at 1545, site level 0. Patient to D/C home.

## 2023-06-03 NOTE — Anesthesia Preprocedure Evaluation (Addendum)
Anesthesia Evaluation  Patient identified by MRN, date of birth, ID band Patient awake    Reviewed: Allergy & Precautions, NPO status , Patient's Chart, lab work & pertinent test results  Airway Mallampati: II  TM Distance: >3 FB Neck ROM: Full    Dental  (+) Dental Advisory Given, Teeth Intact   Pulmonary Patient abstained from smoking., former smoker   Pulmonary exam normal breath sounds clear to auscultation       Cardiovascular hypertension, Pt. on home beta blockers Normal cardiovascular exam Rhythm:Regular Rate:Normal  Echo 05/2023  1. Left ventricular ejection fraction, by estimation, is 55 to 60%. Left ventricular ejection fraction by 3D volume is 56 %. The left ventricle has normal function. The left ventricle has no regional wall motion abnormalities. There is mild left ventricular hypertrophy. Left ventricular diastolic parameters were normal.   2. Right ventricular systolic function is normal. The right ventricular size is mildly enlarged. There is normal pulmonary artery systolic pressure. The estimated right ventricular systolic pressure is 22.0 mmHg.   3. The mitral valve is normal in structure. Trivial mitral valve regurgitation. No evidence of mitral stenosis.   4. The aortic valve is tricuspid. Aortic valve regurgitation is not visualized. No aortic stenosis is present.   5. The inferior vena cava is normal in size with greater than 50% respiratory variability, suggesting right atrial pressure of 3 mmHg.      Neuro/Psych  PSYCHIATRIC DISORDERS Anxiety     negative neurological ROS     GI/Hepatic negative GI ROS, Neg liver ROS,,,  Endo/Other  negative endocrine ROS    Renal/GU Renal disease     Musculoskeletal negative musculoskeletal ROS (+)    Abdominal   Peds  Hematology negative hematology ROS (+)   Anesthesia Other Findings   Reproductive/Obstetrics                              Anesthesia Physical Anesthesia Plan  ASA: 2  Anesthesia Plan:    Post-op Pain Management: Tylenol PO (pre-op)*   Induction: Intravenous  PONV Risk Score and Plan: Treatment may vary due to age or medical condition, Ondansetron and Dexamethasone  Airway Management Planned:   Additional Equipment:   Intra-op Plan:   Post-operative Plan:   Informed Consent: I have reviewed the patients History and Physical, chart, labs and discussed the procedure including the risks, benefits and alternatives for the proposed anesthesia with the patient or authorized representative who has indicated his/her understanding and acceptance.     Dental advisory given  Plan Discussed with: CRNA  Anesthesia Plan Comments:        Anesthesia Quick Evaluation

## 2023-06-04 ENCOUNTER — Encounter (HOSPITAL_COMMUNITY): Payer: Self-pay | Admitting: Cardiology

## 2023-06-04 NOTE — Anesthesia Postprocedure Evaluation (Signed)
Anesthesia Post Note  Patient: Derek [name]  Procedure(s) Performed: SVT ABLATION     Patient location during evaluation: PACU Anesthesia Type: General Level of consciousness: sedated and patient cooperative Pain management: pain level controlled Vital Signs Assessment: post-procedure vital signs reviewed and stable Respiratory status: spontaneous breathing Cardiovascular status: stable Anesthetic complications: no   There were no known notable events for this encounter.  Last Vitals:  Vitals:   06/03/23 1500 06/03/23 1540  BP: (!) 129/93 (!) 145/93  Pulse: 64 77  Resp: 16 (!) 22  Temp:    SpO2: 95% 95%    Last Pain:  Vitals:   06/03/23 1540  TempSrc:   PainSc: 0-No pain                 Lewie Loron

## 2023-06-05 ENCOUNTER — Ambulatory Visit (INDEPENDENT_AMBULATORY_CARE_PROVIDER_SITE_OTHER): Payer: BC Managed Care – PPO

## 2023-06-05 VITALS — HR 56 | Ht 72.0 in | Wt 245.2 lb

## 2023-06-05 DIAGNOSIS — I471 Supraventricular tachycardia, unspecified: Secondary | ICD-10-CM

## 2023-06-05 DIAGNOSIS — I480 Paroxysmal atrial fibrillation: Secondary | ICD-10-CM

## 2023-06-05 NOTE — Progress Notes (Signed)
   Nurse Visit   Date of Encounter: 06/05/2023 ID: Derek [name], DOB [birth year], MRN [id number]  PCP:  Garlan Fillers, MD   [facility] Providers Cardiologist:  Dietrich Pates, MD Electrophysiologist:  Lanier Prude, MD      Visit Details   VS:  Pulse (!) 56   Ht 6' (1.829 m)   Wt 245 lb 3.2 oz (111.2 kg)   BMI 33.26 kg/m  , BMI Body mass index is 33.26 kg/m.  Wt Readings from Last 3 Encounters:  06/05/23 245 lb 3.2 oz (111.2 kg)  06/03/23 240 lb (108.9 kg)  04/30/23 256 lb 12.8 oz (116.5 kg)     Reason for visit: EKG post flecainide start on 06/03/23.  Performed today: EKG, Provider consulted:Dr. Tenny Craw, and Education Changes (medications, testing, etc.) : No changes, continue Flecainide as ordered. Length of Visit: 20 minutes    Medications Adjustments/Labs and Tests Ordered: Orders Placed This Encounter  Procedures   EKG 12-Lead   No orders of the defined types were placed in this encounter.    Fabio Pierce, RN  06/05/2023 2:33 PM

## 2023-06-05 NOTE — Patient Instructions (Signed)
Medication Instructions:  Your physician recommends that you continue on your current medications as directed. Please refer to the Current Medication list given to you today.  *If you need a refill on your cardiac medications before your next appointment, please call your pharmacy*   Lab Work: NONE  If you have labs (blood work) drawn today and your tests are completely normal, you will receive your results only by: MyChart Message (if you have MyChart) OR A paper copy in the mail If you have any lab test that is abnormal or we need to change your treatment, we will call you to review the results.   Testing/Procedures: NONE   Follow-Up:As scheduled At [facility], you and your health needs are our priority.  As part of our continuing mission to provide you with exceptional heart care, we have created designated Provider Care Teams.  These Care Teams include your primary Cardiologist (physician) and Advanced Practice Providers (APPs -  Physician Assistants and Nurse Practitioners) who all work together to provide you with the care you need, when you need it.    

## 2023-06-12 ENCOUNTER — Ambulatory Visit: Payer: BC Managed Care – PPO | Admitting: Cardiology

## 2023-06-12 ENCOUNTER — Ambulatory Visit: Payer: BC Managed Care – PPO

## 2023-06-13 ENCOUNTER — Ambulatory Visit: Payer: BC Managed Care – PPO | Attending: Physician Assistant

## 2023-06-13 DIAGNOSIS — I480 Paroxysmal atrial fibrillation: Secondary | ICD-10-CM | POA: Diagnosis not present

## 2023-06-13 DIAGNOSIS — Z79899 Other long term (current) drug therapy: Secondary | ICD-10-CM

## 2023-06-13 DIAGNOSIS — Z5181 Encounter for therapeutic drug level monitoring: Secondary | ICD-10-CM

## 2023-06-14 LAB — EXERCISE TOLERANCE TEST
Angina Index: 0
Base ST Depression (mm): 0 mm
Duke Treadmill Score: 8
Estimated workload: 9.9
Exercise duration (min): 7 min
Exercise duration (sec): 55 s
MPHR: 160 {beats}/min
Peak HR: 141 {beats}/min
Percent HR: 88 %
RPE: 15
Rest HR: 61 {beats}/min
ST Depression (mm): 0 mm

## 2023-06-17 ENCOUNTER — Encounter (HOSPITAL_COMMUNITY): Payer: Self-pay | Admitting: Internal Medicine

## 2023-06-17 ENCOUNTER — Ambulatory Visit (HOSPITAL_COMMUNITY)
Admission: RE | Admit: 2023-06-17 | Discharge: 2023-06-17 | Disposition: A | Discharge status: Home / Self Care | Payer: BC Managed Care – PPO | Source: Ambulatory Visit | Attending: Internal Medicine | Admitting: Internal Medicine

## 2023-06-17 VITALS — BP 136/94 | HR 74 | Ht 72.0 in | Wt 247.2 lb

## 2023-06-17 DIAGNOSIS — I4719 Other supraventricular tachycardia: Secondary | ICD-10-CM | POA: Diagnosis not present

## 2023-06-17 DIAGNOSIS — I509 Heart failure, unspecified: Secondary | ICD-10-CM | POA: Insufficient documentation

## 2023-06-17 DIAGNOSIS — Z79899 Other long term (current) drug therapy: Secondary | ICD-10-CM | POA: Insufficient documentation

## 2023-06-17 DIAGNOSIS — I110 Hypertensive heart disease with heart failure: Secondary | ICD-10-CM | POA: Diagnosis not present

## 2023-06-17 DIAGNOSIS — I484 Atypical atrial flutter: Secondary | ICD-10-CM | POA: Diagnosis not present

## 2023-06-17 DIAGNOSIS — I480 Paroxysmal atrial fibrillation: Secondary | ICD-10-CM | POA: Diagnosis not present

## 2023-06-17 NOTE — Patient Instructions (Signed)
 Kardia Mobile

## 2023-06-17 NOTE — Progress Notes (Addendum)
Primary Care Physician: Derek Fillers, MD Primary Cardiologist: Derek Pates, MD Electrophysiologist: Derek Prude, MD     Referring Physician: [name], [name]     Derek [name] is a 62 y.o. male with a history of SVT, HTN, and paroxysmal atrial fibrillation who presents for consultation in the [facility]. He is s/p SVT ablation by Dr. Lalla [name] on 06/03/23. EP study able to induce Afib, multiple atypical atrial flutters, and atrial tachycardia during study. Flecainide started 50 mg BID and diltiazem 120 mg daily. Treadmill stress test on 06/13/23 normal. Patient is on Eliquis 5 mg BID for a CHADS2VASC score of 1.  On evaluation today, he is currently in NSR. He states he has felt a lot better since ablation. He kind of remembers what Dr. Lalla [name] said after the procedure regarding additional abnormal rhythms but hard time recollecting everything. Patient states he has a history of factor V Leiden and has been on Eliquis for 8-9 years. He felt fluttering a couple days after the procedure but none since. No chest pain or SOB. Leg sites healed without issue. He does not drink coffee. He admits to one evening bourbon. He states had sleep study 3 years ago which showed very mild OSA and he declined treatment due to cost and it being very mild.   Today, he denies symptoms of palpitations, orthopnea, PND, lower extremity edema, dizziness, presyncope, syncope, snoring, daytime somnolence, bleeding, or neurologic sequela. The patient is tolerating medications without difficulties and is otherwise without complaint today.   he has a BMI of Body mass index is 33.53 kg/m.Marland Kitchen Filed Weights   06/17/23 1518  Weight: 112.1 kg    Current Outpatient Medications  Medication Sig Dispense Refill   ALPRAZolam (XANAX) 1 MG tablet TAKE 1/2-1 TAB PO TID PRN PANIC (Patient taking differently: Take 0.5 mg by mouth 2 (two) times daily as needed for anxiety.) 90 tablet 5   augmented  betamethasone dipropionate (DIPROLENE-AF) 0.05 % cream Apply 1 Application topically daily as needed (rash on arm).     DEPO-TESTOSTERONE 200 MG/ML injection Inject 100 mg into the muscle every 28 (twenty-eight) days.     diltiazem (CARDIZEM CD) 120 MG 24 hr capsule Take 1 capsule (120 mg total) by mouth daily. 30 capsule 11   ELIQUIS 5 MG TABS tablet Take 5 mg by mouth 2 (two) times daily.  3   flecainide (TAMBOCOR) 50 MG tablet Take 1 tablet (50 mg total) by mouth 2 (two) times daily. 60 tablet 5   Naftifine HCl 2 % GEL Apply 1 Application topically 2 (two) times daily as needed (rash).     sertraline (ZOLOFT) 100 MG tablet TAKE 1/2 OR 1 TABLET BY MOUTH EVERY DAY (Patient taking differently: Take 50 mg by mouth in the morning.) 90 tablet 1   No current facility-administered medications for this encounter.    Atrial Fibrillation Management history:  Previous antiarrhythmic drugs: flecainide Previous cardioversions: None Previous ablations: SVT ablation 06/03/23 Anticoagulation history: Eliquis    ROS- All systems are reviewed and negative except as per the HPI above.  Physical Exam: BP (!) 136/94   Pulse 74   Ht 6' (1.829 m)   Wt 112.1 kg   BMI 33.53 kg/m   GEN: Well nourished, well developed in no acute distress NECK: No JVD; No carotid bruits CARDIAC: Regular rate and rhythm, no murmurs, rubs, gallops RESPIRATORY:  Clear to auscultation without rales, wheezing or rhonchi  ABDOMEN: Soft, non-tender, non-distended  EXTREMITIES:  No edema; No deformity   EKG today demonstrates  Vent. rate 74 BPM PR interval 152 ms QRS duration 90 ms QT/QTcB 404/448 ms P-R-T axes 45 56 16 Normal sinus rhythm Normal ECG When compared with ECG of 05-Jun-2023 14:23, PREVIOUS ECG IS PRESENT  Echo 05/21/23 demonstrated: 1. Left ventricular ejection fraction, by estimation, is 55 to 60%. Left  ventricular ejection fraction by 3D volume is 56 %. The left ventricle has  normal function. The left  ventricle has no regional wall motion  abnormalities. There is mild left  ventricular hypertrophy. Left ventricular diastolic parameters were  normal.   2. Right ventricular systolic function is normal. The right ventricular  size is mildly enlarged. There is normal pulmonary artery systolic  pressure. The estimated right ventricular systolic pressure is 22.0 mmHg.   3. The mitral valve is normal in structure. Trivial mitral valve  regurgitation. No evidence of mitral stenosis.   4. The aortic valve is tricuspid. Aortic valve regurgitation is not  visualized. No aortic stenosis is present.   5. The inferior vena cava is normal in size with greater than 50%  respiratory variability, suggesting right atrial pressure of 3 mmHg.   Treadmill stress test 06/13/23:   No ST deviation was noted. There were no arrhythmias during stress. ECG was interpretable and conclusive. The ECG was negative for ischemia.   This is a low risk study.   ASSESSMENT & PLAN CHA2DS2-VASc Score = 1  The patient's score is based upon: CHF History: 0 HTN History: 1 Diabetes History: 0 Stroke History: 0 Vascular Disease History: 0 Age Score: 0 Gender Score: 0       ASSESSMENT AND PLAN: Paroxysmal Atrial Fibrillation (ICD10:  I48.0), SVT, atypical atrial flutter, atrial tachycardia The patient's CHA2DS2-VASc score is 1, indicating a 0.6% annual risk of stroke.    He is s/p SVT ablation by Dr. Lalla [name] on 06/03/23. During EP study, he had multiple inducible arrhythmias noted above.  He is in NSR. Continue flecainide 50 mg BID. Continue diltiazem 120 mg daily.   Rhythm monitoring device recommended for home. He can provide rhythm strips to Dr. Lalla [name] to determine if continuing to have arrhythmia(s) despite flecainide treatment. He seemed a little hesitant to proceed with another ablation due to scarring in heart from multiple procedures. We discussed briefly long term medication therapy as he is on and ablation in  the future pending burden / symptoms.      Follow up as scheduled with Dr. Lalla [name].   Derek Bells, [name]  Afib Clinic [facility] [address] [place], [place] [address] [phone]

## 2023-06-18 DIAGNOSIS — Z0289 Encounter for other administrative examinations: Secondary | ICD-10-CM

## 2023-06-24 ENCOUNTER — Telehealth: Payer: Self-pay | Admitting: Cardiology

## 2023-06-24 NOTE — Telephone Encounter (Signed)
Pt c/o medication issue:  1. Name of Medication: flecainide (TAMBOCOR) 50 MG tablet   2. How are you currently taking this medication (dosage and times per day)? As written  3. Are you having a reaction (difficulty breathing--STAT)? No   4. What is your medication issue? Pt states since taking this medication his heart has been pounding, nauseated, dizzy, and lightheaded. He states he is unable to do anything. Pt asked to make a sooner appt, informed him Im unable to find anything sooner right now but he was added to waitlist. Please advise.

## 2023-06-25 ENCOUNTER — Encounter (HOSPITAL_COMMUNITY): Payer: Self-pay | Admitting: Physician Assistant

## 2023-06-25 ENCOUNTER — Inpatient Hospital Stay (HOSPITAL_COMMUNITY)
Admission: RE | Admit: 2023-06-25 | Discharge: 2023-06-25 | Disposition: A | Discharge status: Home / Self Care | Payer: BC Managed Care – PPO | Source: Ambulatory Visit | Attending: Physician Assistant | Admitting: Physician Assistant

## 2023-06-25 ENCOUNTER — Ambulatory Visit (HOSPITAL_COMMUNITY)
Admission: RE | Admit: 2023-06-25 | Discharge: 2023-06-25 | Disposition: A | Discharge status: Home / Self Care | Payer: BC Managed Care – PPO | Source: Ambulatory Visit | Attending: Physician Assistant | Admitting: Physician Assistant

## 2023-06-25 VITALS — BP 118/78 | HR 66

## 2023-06-25 DIAGNOSIS — Z7901 Long term (current) use of anticoagulants: Secondary | ICD-10-CM | POA: Diagnosis not present

## 2023-06-25 DIAGNOSIS — I4719 Other supraventricular tachycardia: Secondary | ICD-10-CM | POA: Insufficient documentation

## 2023-06-25 DIAGNOSIS — I480 Paroxysmal atrial fibrillation: Secondary | ICD-10-CM

## 2023-06-25 DIAGNOSIS — I10 Essential (primary) hypertension: Secondary | ICD-10-CM | POA: Insufficient documentation

## 2023-06-25 DIAGNOSIS — R079 Chest pain, unspecified: Secondary | ICD-10-CM | POA: Diagnosis not present

## 2023-06-25 DIAGNOSIS — Z79899 Other long term (current) drug therapy: Secondary | ICD-10-CM | POA: Diagnosis not present

## 2023-06-25 DIAGNOSIS — I484 Atypical atrial flutter: Secondary | ICD-10-CM | POA: Diagnosis not present

## 2023-06-25 NOTE — Patient Instructions (Addendum)
Stop flecainide

## 2023-06-25 NOTE — Progress Notes (Signed)
Primary Care Physician: Garlan Fillers, MD Primary Cardiologist: Dietrich Pates, MD Electrophysiologist: Lanier Prude, MD  Referring Physician: [name], [name]     Derek [name] is a 62 y.o. male with a history of SVT, HTN, and paroxysmal atrial fibrillation who presents for consultation in the [facility]. He is s/p SVT ablation by Dr. Lalla Brothers on 06/03/23. EP study able to induce Afib, multiple atypical atrial flutters, and atrial tachycardia during study. Flecainide started 50 mg BID and diltiazem 120 mg daily. Treadmill stress test on 06/13/23 normal. Patient is on Eliquis 5 mg BID for a CHADS2VASC score of 1. Patient has a history of factor V Leiden and has been on Eliquis for 8-9 years.   On follow up today, patient called Dr Lovena Neighbours office yesterday with symptoms of lightheadedness, "stabbing" chest pain, and fatigue on exertion. This has been ongoing for 5-6 days. He states that he feels poorly ~75% of the time. There are no specific triggers that he can identify but he feels better with rest.   Today, he denies symptoms of palpitations, orthopnea, PND, lower extremity edema, dizziness, presyncope, syncope, snoring, daytime somnolence, bleeding, or neurologic sequela. The patient is tolerating medications without difficulties and is otherwise without complaint today.    Current Outpatient Medications  Medication Sig Dispense Refill   ALPRAZolam (XANAX) 1 MG tablet TAKE 1/2-1 TAB PO TID PRN PANIC (Patient taking differently: Take 0.5 mg by mouth 2 (two) times daily as needed for anxiety.) 90 tablet 5   augmented betamethasone dipropionate (DIPROLENE-AF) 0.05 % cream Apply 1 Application topically daily as needed (rash on arm).     DEPO-TESTOSTERONE 200 MG/ML injection Inject 100 mg into the muscle every 28 (twenty-eight) days.     diltiazem (CARDIZEM CD) 120 MG 24 hr capsule Take 1 capsule (120 mg total) by mouth daily. 30 capsule 11   ELIQUIS 5 MG TABS  tablet Take 5 mg by mouth 2 (two) times daily.  3   Naftifine HCl 2 % GEL Apply 1 Application topically 2 (two) times daily as needed (rash).     sertraline (ZOLOFT) 100 MG tablet TAKE 1/2 OR 1 TABLET BY MOUTH EVERY DAY (Patient taking differently: Take 50 mg by mouth in the morning.) 90 tablet 1   No current facility-administered medications for this encounter.    Atrial Fibrillation Management history:  Previous antiarrhythmic drugs: flecainide Previous cardioversions: None Previous ablations: SVT ablation 06/03/23 Anticoagulation history: Eliquis    ROS- All systems are reviewed and negative except as per the HPI above.  Physical Exam: BP 118/78   Pulse 66   GEN: Well nourished, well developed in no acute distress NECK: No JVD; No carotid bruits CARDIAC: Regular rate and rhythm, no murmurs, rubs, gallops RESPIRATORY:  Clear to auscultation without rales, wheezing or rhonchi  ABDOMEN: Soft, non-tender, non-distended EXTREMITIES:  No edema; No deformity    EKG today demonstrates  SR Vent. rate 66 BPM PR interval 164 ms QRS duration 92 ms QT/QTcB 412/431 ms  Echo 05/21/23 demonstrated: 1. Left ventricular ejection fraction, by estimation, is 55 to 60%. Left  ventricular ejection fraction by 3D volume is 56 %. The left ventricle has  normal function. The left ventricle has no regional wall motion  abnormalities. There is mild left ventricular hypertrophy. Left ventricular diastolic parameters were normal.   2. Right ventricular systolic function is normal. The right ventricular  size is mildly enlarged. There is normal pulmonary artery systolic  pressure.  The estimated right ventricular systolic pressure is 22.0 mmHg.   3. The mitral valve is normal in structure. Trivial mitral valve  regurgitation. No evidence of mitral stenosis.   4. The aortic valve is tricuspid. Aortic valve regurgitation is not  visualized. No aortic stenosis is present.   5. The inferior vena cava is  normal in size with greater than 50%  respiratory variability, suggesting right atrial pressure of 3 mmHg.   Treadmill stress test 06/13/23:   No ST deviation was noted. There were no arrhythmias during stress. ECG was interpretable and conclusive. The ECG was negative for ischemia.   This is a low risk study.   ASSESSMENT & PLAN CHA2DS2-VASc Score = 1  The patient's score is based upon: CHF History: 0 HTN History: 1 Diabetes History: 0 Stroke History: 0 Vascular Disease History: 0 Age Score: 0 Gender Score: 0       ASSESSMENT AND PLAN: Paroxysmal Atrial Fibrillation, SVT, atypical atrial flutter, atrial tachycardia The patient's CHA2DS2-VASc score is 1, indicating a 0.6% annual risk of stroke.   He is s/p SVT ablation by Dr. Lalla Brothers on 06/03/23. During EP study, he had multiple inducible arrhythmias noted above.  Unclear etiology for his symptoms. He is concerned that it is the new medications.  Will stop flecainide and continue diltiazem 120 mg daily to see if his symptoms improve. Will also have him wear a Zio monitor to evaluate for symptomatic arrhythmias.  Not currently on anticoagulation with low CV score.   Fatigue on exertion/lightheadedness Will have him wear a monitor as above and discontinue flecainide.  Chest pain Atypical in nature, recent stress test low risk. ECG today shows no acute changes. Low suspicion for cardiac etiology.     Follow up as scheduled with Dr. Lalla Brothers.   Danice Goltz, PA  Afib Clinic [facility] [address] [place], [place] [address] [phone]

## 2023-06-25 NOTE — Telephone Encounter (Signed)
Per Jorja Loa PA will bring in for EKG to assess rhythm. Pt in agreement.

## 2023-06-25 NOTE — Telephone Encounter (Signed)
Spoke with the patient who reports that after his SVT ablation he was feeling great. He was noted to have AFIB, atypical atrial flutter and atrial tachycardia during the EP study. He was started on diltiazem and flecainide. He states that he was able to complete his stress test with no problem. He saw AFIB clinic last week and reported doing well. He states that about 4 days ago he started to feel poorly. He states that he has been getting short of breath with exertion and has been very fatigued. He denies any chest pain. States that he has not felt any palpitations. His heart rate has been staying 60-70. BP has been normal per his report. He states that he does not have a watch or kardia mobile to check his heart rhythm. He states that he feels similar to how he did prior to his ablation and any sort of physical exertion wears him out. He is concerned that one of his new medications could be causing his symptoms as he has had difficulty in the past tolerating meds.

## 2023-06-26 ENCOUNTER — Telehealth (HOSPITAL_COMMUNITY): Payer: Self-pay | Admitting: *Deleted

## 2023-06-26 NOTE — Telephone Encounter (Signed)
Pt called in again today stating "he feels worse" since stopping flecainide. HR and BP are within normal range 110/82 HR 76. Similar symptoms to yesterday nausea, lightheaded just overall not feeling well. Felt better on flecainide - per Jorja Loa PA ok to resume flecainide 50mg  BID. Pt inquired if note could be writtten to take him out of work pt was instructed he would need to contact Dr. Lovena Neighbours office regarding this as he was in NSR at his office visit yesterday. Pt is currently wearing zio monitor for the next week.

## 2023-07-01 ENCOUNTER — Telehealth: Payer: Self-pay | Admitting: Cardiology

## 2023-07-01 ENCOUNTER — Ambulatory Visit: Payer: BC Managed Care – PPO | Admitting: Psychiatry

## 2023-07-01 NOTE — Telephone Encounter (Signed)
Pt c/o Shortness Of Breath: STAT if SOB developed within the last 24 hours or pt is noticeably SOB on the phone  1. Are you currently SOB (can you hear that pt is SOB on the phone)?  No   2. How long have you been experiencing SOB? About a week   3. Are you SOB when sitting or when up moving around? Up Moving around and get tried fast   4. Are you currently experiencing any other symptoms? Last week he was having some chest pain but not in the last couple of days,  Dizziness and sleeping a lot  Pt had an ablation about 4 weeks ago.  He has an appt Monday but is concerned.    Best number [phone]

## 2023-07-01 NOTE — Telephone Encounter (Signed)
Spoke with the patient who states that he has started  back on his flecainide and felt better for a few days but he is back to not feeling well. He states that he does not have a lot of energy. He becomes short of breath with minimal exertion. He states that he is sleeping a lot. Reports that heart rates have been 75-80. He denies any fluttering or heart racing. He is scheduled to see Dr. Lalla Brothers on 8/5 and will discuss possible medication changes at that time.

## 2023-07-03 ENCOUNTER — Telehealth: Payer: Self-pay | Admitting: Cardiology

## 2023-07-03 ENCOUNTER — Other Ambulatory Visit: Payer: Self-pay

## 2023-07-03 MED ORDER — FLECAINIDE ACETATE 50 MG PO TABS: 50.0000 mg | ORAL_TABLET | Freq: Two times a day (BID) | ORAL | 1 refills | Status: DC

## 2023-07-03 MED ORDER — DILTIAZEM HCL ER COATED BEADS 120 MG PO CP24: 120.0000 mg | ORAL_CAPSULE | Freq: Every day | ORAL | 6 refills | Status: DC

## 2023-07-03 NOTE — Telephone Encounter (Signed)
*  STAT* If patient is at the pharmacy, call can be transferred to refill team.   1. Which medications need to be refilled? (please list name of each medication and dose if known)   flecainide (TAMBOCOR) 50 MG tablet    2. Which pharmacy/location (including street and city if local pharmacy) is medication to be sent to?[id number] [place], [place] - [address]   3. Do they need a 30 day or 90 day supply? 90 day

## 2023-07-03 NOTE — Telephone Encounter (Signed)
This is a A-Fib clinic pt that was seen in July 2024. Please address

## 2023-07-03 NOTE — Telephone Encounter (Signed)
This is a A-Fib clinic pt 

## 2023-07-03 NOTE — Telephone Encounter (Signed)
This is a A-Fib clinic pt that was sent in July 2024. Please address

## 2023-07-08 ENCOUNTER — Ambulatory Visit: Payer: BC Managed Care – PPO | Attending: Cardiology | Admitting: Cardiology

## 2023-07-08 ENCOUNTER — Encounter: Payer: Self-pay | Admitting: Cardiology

## 2023-07-08 VITALS — BP 142/84 | HR 56 | Ht 72.0 in | Wt 245.0 lb

## 2023-07-08 DIAGNOSIS — Z79899 Other long term (current) drug therapy: Secondary | ICD-10-CM

## 2023-07-08 DIAGNOSIS — I480 Paroxysmal atrial fibrillation: Secondary | ICD-10-CM | POA: Diagnosis not present

## 2023-07-08 DIAGNOSIS — I471 Supraventricular tachycardia, unspecified: Secondary | ICD-10-CM

## 2023-07-08 DIAGNOSIS — I4719 Other supraventricular tachycardia: Secondary | ICD-10-CM

## 2023-07-08 NOTE — Progress Notes (Signed)
  Electrophysiology Office Follow up Visit Note:    Date:  07/08/2023   ID:  Derek [name], DOB 1961/06/24, MRN [id number]  PCP:  Garlan Fillers, MD  [facility] Cardiologist:  Dietrich Pates, MD  [facility] Electrophysiologist:  Lanier Prude, MD    Interval History:    Derek [name] is a 62 y.o. male who presents for a follow up visit.   He had an SVT ablation 06/03/2023.  I ablated AVNRT but observed AF/AFL and AT during the EP study. He takes eliquis for stroke ppx.  He was started on flecainide and diltiazem after the EP study and noticed a dramatic improvement in his arrhythmias on this medication regimen. 06/14/2023 ETT showed no adverse electrical abnormalities while on flecainide with exertion.   He still reports "bad days" but tells me these are not related to arrhythmias or palpitations.      Past medical, surgical, social and family history were reviewed.  ROS:   Please see the history of present illness.    All other systems reviewed and are negative.  EKGs/Labs/Other Studies Reviewed:    The following studies were reviewed today:  07/04/2023 Zio personally reviewed No AF/AFL/AT Average HR 67   ETT personally reviewed from 06/14/2023.  EKG Interpretation Date/Time:  Monday July 08 2023 15:47:06 EDT Ventricular Rate:  56 PR Interval:  158 QRS Duration:  92 QT Interval:  424 QTC Calculation: 409 R Axis:   55  Text Interpretation: Sinus bradycardia Confirmed by Steffanie Dunn [phone] on 07/08/2023 4:03:57 PM    Physical Exam:    VS:  BP (!) 142/84   Pulse (!) 56   Ht 6' (1.829 m)   Wt 245 lb (111.1 kg)   SpO2 95%   BMI 33.23 kg/m     Wt Readings from Last 3 Encounters:  07/08/23 245 lb (111.1 kg)  06/17/23 247 lb 3.2 oz (112.1 kg)  06/05/23 245 lb 3.2 oz (111.2 kg)     GEN: Well nourished, well developed in no acute distress. Obese. CARDIAC: RRR, no murmurs, rubs, gallops RESPIRATORY:  Clear to auscultation without rales, wheezing or  rhonchi       ASSESSMENT:    1. Paroxysmal atrial fibrillation (HCC)   2. Encounter for long-term (current) use of high-risk medication   3. SVT (supraventricular tachycardia)   4. Atrial tachycardia   5. AVNRT (AV nodal re-entry tachycardia)    PLAN:    In order of problems listed above:  #AVNRT Doing well after slow pathway ablation on 06/03/2023. No recurrence.  #AF/AFL/AT #High risk med monitoring - flecainide Observed during the EPS in 06/2023. Cont Flecainide and Diltiazem. PR and QRS durations acceptable for ongoing flecainide use. Cont eliquis for stroke ppx.  #Anxiety I suspect some of his symptoms are related to incompletely treated anxiety/depression.   He did ask today about disability. I have encouraged him to follow up with his primary care physician to discuss further given there are no arrhythmic causes of his symptoms.  Follow up 6 months with APP.    Signed, Steffanie Dunn, MD, [facility], [facility] 07/08/2023 5:15 PM    Electrophysiology [facility]

## 2023-07-08 NOTE — Patient Instructions (Signed)
Medication Instructions:  Your physician recommends that you continue on your current medications as directed. Please refer to the Current Medication list given to you today.  *If you need a refill on your cardiac medications before your next appointment, please call your pharmacy*  Follow-Up: At [facility], you and your health needs are our priority.  As part of our continuing mission to provide you with exceptional heart care, we have created designated Provider Care Teams.  These Care Teams include your primary Cardiologist (physician) and Advanced Practice Providers (APPs -  Physician Assistants and Nurse Practitioners) who all work together to provide you with the care you need, when you need it.  Your next appointment:   6 month(s)  Provider:   You will see one of the following Advanced Practice Providers on your designated Care Team:   [name], Charlott Holler [address] [place], [place] Sherie Don, NP Canary Brim, NP

## 2023-07-24 ENCOUNTER — Emergency Department (HOSPITAL_COMMUNITY): Payer: BC Managed Care – PPO

## 2023-07-24 ENCOUNTER — Other Ambulatory Visit: Payer: Self-pay

## 2023-07-24 ENCOUNTER — Encounter (HOSPITAL_COMMUNITY): Payer: Self-pay

## 2023-07-24 ENCOUNTER — Inpatient Hospital Stay (HOSPITAL_COMMUNITY)
Admission: EM | Admit: 2023-07-24 | Discharge: 2023-07-25 | DRG: 183 | Disposition: A | Discharge status: Home / Self Care | Payer: BC Managed Care – PPO | Attending: General Surgery | Admitting: General Surgery

## 2023-07-24 DIAGNOSIS — I480 Paroxysmal atrial fibrillation: Secondary | ICD-10-CM | POA: Diagnosis not present

## 2023-07-24 DIAGNOSIS — S2241XA Multiple fractures of ribs, right side, initial encounter for closed fracture: Principal | ICD-10-CM | POA: Diagnosis present

## 2023-07-24 DIAGNOSIS — F419 Anxiety disorder, unspecified: Secondary | ICD-10-CM | POA: Diagnosis not present

## 2023-07-24 DIAGNOSIS — I6523 Occlusion and stenosis of bilateral carotid arteries: Secondary | ICD-10-CM | POA: Diagnosis not present

## 2023-07-24 DIAGNOSIS — S3991XA Unspecified injury of abdomen, initial encounter: Secondary | ICD-10-CM | POA: Diagnosis not present

## 2023-07-24 DIAGNOSIS — R0989 Other specified symptoms and signs involving the circulatory and respiratory systems: Secondary | ICD-10-CM | POA: Diagnosis not present

## 2023-07-24 DIAGNOSIS — Z8249 Family history of ischemic heart disease and other diseases of the circulatory system: Secondary | ICD-10-CM

## 2023-07-24 DIAGNOSIS — Z23 Encounter for immunization: Secondary | ICD-10-CM | POA: Diagnosis not present

## 2023-07-24 DIAGNOSIS — Z79899 Other long term (current) drug therapy: Secondary | ICD-10-CM

## 2023-07-24 DIAGNOSIS — J9811 Atelectasis: Secondary | ICD-10-CM | POA: Diagnosis not present

## 2023-07-24 DIAGNOSIS — T07XXXA Unspecified multiple injuries, initial encounter: Secondary | ICD-10-CM

## 2023-07-24 DIAGNOSIS — F1090 Alcohol use, unspecified, uncomplicated: Secondary | ICD-10-CM | POA: Diagnosis present

## 2023-07-24 DIAGNOSIS — Z7901 Long term (current) use of anticoagulants: Secondary | ICD-10-CM

## 2023-07-24 DIAGNOSIS — S27321A Contusion of lung, unilateral, initial encounter: Secondary | ICD-10-CM | POA: Diagnosis not present

## 2023-07-24 DIAGNOSIS — S271XXA Traumatic hemothorax, initial encounter: Secondary | ICD-10-CM | POA: Diagnosis present

## 2023-07-24 DIAGNOSIS — Y900 Blood alcohol level of less than 20 mg/100 ml: Secondary | ICD-10-CM | POA: Diagnosis present

## 2023-07-24 DIAGNOSIS — S299XXA Unspecified injury of thorax, initial encounter: Secondary | ICD-10-CM | POA: Diagnosis not present

## 2023-07-24 DIAGNOSIS — J939 Pneumothorax, unspecified: Secondary | ICD-10-CM | POA: Diagnosis not present

## 2023-07-24 DIAGNOSIS — J329 Chronic sinusitis, unspecified: Secondary | ICD-10-CM | POA: Diagnosis not present

## 2023-07-24 DIAGNOSIS — R918 Other nonspecific abnormal finding of lung field: Secondary | ICD-10-CM | POA: Diagnosis not present

## 2023-07-24 DIAGNOSIS — D6851 Activated protein C resistance: Secondary | ICD-10-CM | POA: Diagnosis present

## 2023-07-24 DIAGNOSIS — Z87891 Personal history of nicotine dependence: Secondary | ICD-10-CM | POA: Diagnosis not present

## 2023-07-24 DIAGNOSIS — J942 Hemothorax: Secondary | ICD-10-CM | POA: Diagnosis not present

## 2023-07-24 DIAGNOSIS — R069 Unspecified abnormalities of breathing: Secondary | ICD-10-CM | POA: Diagnosis not present

## 2023-07-24 DIAGNOSIS — I700 Atherosclerosis of aorta: Secondary | ICD-10-CM | POA: Diagnosis not present

## 2023-07-24 DIAGNOSIS — S2249XA Multiple fractures of ribs, unspecified side, initial encounter for closed fracture: Secondary | ICD-10-CM | POA: Diagnosis present

## 2023-07-24 DIAGNOSIS — K5730 Diverticulosis of large intestine without perforation or abscess without bleeding: Secondary | ICD-10-CM | POA: Diagnosis not present

## 2023-07-24 DIAGNOSIS — S0990XA Unspecified injury of head, initial encounter: Secondary | ICD-10-CM | POA: Diagnosis not present

## 2023-07-24 DIAGNOSIS — Z86718 Personal history of other venous thrombosis and embolism: Secondary | ICD-10-CM | POA: Diagnosis not present

## 2023-07-24 DIAGNOSIS — I10 Essential (primary) hypertension: Secondary | ICD-10-CM | POA: Diagnosis not present

## 2023-07-24 DIAGNOSIS — S3993XA Unspecified injury of pelvis, initial encounter: Secondary | ICD-10-CM | POA: Diagnosis not present

## 2023-07-24 DIAGNOSIS — Y92410 Unspecified street and highway as the place of occurrence of the external cause: Secondary | ICD-10-CM

## 2023-07-24 DIAGNOSIS — S2231XA Fracture of one rib, right side, initial encounter for closed fracture: Secondary | ICD-10-CM | POA: Diagnosis not present

## 2023-07-24 DIAGNOSIS — S2241XD Multiple fractures of ribs, right side, subsequent encounter for fracture with routine healing: Secondary | ICD-10-CM | POA: Diagnosis not present

## 2023-07-24 LAB — COMPREHENSIVE METABOLIC PANEL
ALT: 42 U/L (ref 0–44)
AST: 52 U/L — ABNORMAL HIGH (ref 15–41)
Albumin: 3.8 g/dL (ref 3.5–5.0)
Alkaline Phosphatase: 105 U/L (ref 38–126)
Anion gap: 10 (ref 5–15)
BUN: 14 mg/dL (ref 6–20)
CO2: 22 mmol/L (ref 22–32)
Calcium: 8.8 mg/dL — ABNORMAL LOW (ref 8.9–10.3)
Chloride: 104 mmol/L (ref 98–111)
Creatinine, Ser: 1.11 mg/dL (ref 0.61–1.24)
GFR, Estimated: >60 mL/min (ref >60)
Glucose, Bld: 114 mg/dL — ABNORMAL HIGH (ref 70–99)
Potassium: 4 mmol/L (ref 3.5–5.1)
Sodium: 136 mmol/L (ref 135–145)
Total Bilirubin: 1.1 mg/dL (ref 0.3–1.2)
Total Protein: 6.8 g/dL (ref 6.5–8.1)

## 2023-07-24 LAB — SAMPLE TO BLOOD BANK

## 2023-07-24 LAB — CBC
HCT: 49.5 % (ref 39.0–52.0)
Hemoglobin: 16.2 g/dL (ref 13.0–17.0)
MCH: 29.9 pg (ref 26.0–34.0)
MCHC: 32.7 g/dL (ref 30.0–36.0)
MCV: 91.3 fL (ref 80.0–100.0)
Platelets: 230 10*3/uL (ref 150–400)
RBC: 5.42 MIL/uL (ref 4.22–5.81)
RDW: 13.3 % (ref 11.5–15.5)
WBC: 8.1 10*3/uL (ref 4.0–10.5)
nRBC: 0 % (ref 0.0–0.2)

## 2023-07-24 LAB — PROTIME-INR
INR: 1.2 (ref 0.8–1.2)
Prothrombin Time: 15.2 seconds (ref 11.4–15.2)

## 2023-07-24 LAB — I-STAT CHEM 8, ED
BUN: 17 mg/dL (ref 6–20)
Calcium, Ion: 1.16 mmol/L (ref 1.15–1.40)
Chloride: 102 mmol/L (ref 98–111)
Creatinine, Ser: 1.1 mg/dL (ref 0.61–1.24)
Glucose, Bld: 112 mg/dL — ABNORMAL HIGH (ref 70–99)
HCT: 50 % (ref 39.0–52.0)
Hemoglobin: 17 g/dL (ref 13.0–17.0)
Potassium: 4 mmol/L (ref 3.5–5.1)
Sodium: 141 mmol/L (ref 135–145)
TCO2: 25 mmol/L (ref 22–32)

## 2023-07-24 LAB — ETHANOL: Alcohol, Ethyl (B): <10 mg/dL (ref <10)

## 2023-07-24 LAB — I-STAT CG4 LACTIC ACID, ED: Lactic Acid, Venous: 1.4 mmol/L (ref 0.5–1.9)

## 2023-07-24 LAB — HIV ANTIBODY (ROUTINE TESTING W REFLEX): HIV Screen 4th Generation wRfx: NONREACTIVE

## 2023-07-24 MED ORDER — ONDANSETRON HCL 4 MG/2ML IJ SOLN
4.0000 mg | Freq: Once | INTRAMUSCULAR | Status: AC
  Administered 2023-07-24: 4 mg via INTRAVENOUS
  Filled 2023-07-24: qty 2

## 2023-07-24 MED ORDER — THIAMINE HCL 100 MG/ML IJ SOLN: 100.0000 mg | Freq: Every day | INTRAMUSCULAR | Status: DC

## 2023-07-24 MED ORDER — FLECAINIDE ACETATE 50 MG PO TABS
50.0000 mg | ORAL_TABLET | Freq: Two times a day (BID) | ORAL | Status: DC
  Administered 2023-07-24 – 2023-07-25 (×2): 50 mg via ORAL
  Filled 2023-07-24 (×2): qty 1

## 2023-07-24 MED ORDER — KETOROLAC TROMETHAMINE 15 MG/ML IJ SOLN
15.0000 mg | Freq: Four times a day (QID) | INTRAMUSCULAR | Status: DC
  Administered 2023-07-24 – 2023-07-25 (×3): 15 mg via INTRAVENOUS
  Filled 2023-07-24 (×3): qty 1

## 2023-07-24 MED ORDER — OXYCODONE HCL 5 MG PO TABS
5.0000 mg | ORAL_TABLET | ORAL | Status: DC | PRN
  Administered 2023-07-24 (×2): 10 mg via ORAL
  Administered 2023-07-25: 5 mg via ORAL
  Filled 2023-07-24 (×3): qty 2

## 2023-07-24 MED ORDER — HYDRALAZINE HCL 20 MG/ML IJ SOLN: 10.0000 mg | INTRAMUSCULAR | Status: DC | PRN

## 2023-07-24 MED ORDER — METHOCARBAMOL 1000 MG/10ML IJ SOLN
500.0000 mg | Freq: Four times a day (QID) | INTRAVENOUS | Status: DC
  Filled 2023-07-24: qty 5

## 2023-07-24 MED ORDER — METHOCARBAMOL 500 MG PO TABS
1000.0000 mg | ORAL_TABLET | Freq: Four times a day (QID) | ORAL | Status: DC
  Administered 2023-07-24 – 2023-07-25 (×4): 1000 mg via ORAL
  Filled 2023-07-24 (×4): qty 2

## 2023-07-24 MED ORDER — SERTRALINE HCL 50 MG PO TABS
50.0000 mg | ORAL_TABLET | Freq: Every morning | ORAL | Status: DC
  Administered 2023-07-25: 50 mg via ORAL
  Filled 2023-07-24: qty 1

## 2023-07-24 MED ORDER — DILTIAZEM HCL ER COATED BEADS 120 MG PO CP24
120.0000 mg | ORAL_CAPSULE | Freq: Every day | ORAL | Status: DC
  Administered 2023-07-25: 120 mg via ORAL
  Filled 2023-07-24: qty 1

## 2023-07-24 MED ORDER — ACETAMINOPHEN 500 MG PO TABS: 1000.0000 mg | ORAL_TABLET | Freq: Four times a day (QID) | ORAL | Status: DC

## 2023-07-24 MED ORDER — ACETAMINOPHEN 500 MG PO TABS
1000.0000 mg | ORAL_TABLET | Freq: Once | ORAL | Status: AC
  Administered 2023-07-24: 1000 mg via ORAL
  Filled 2023-07-24: qty 2

## 2023-07-24 MED ORDER — LORAZEPAM 1 MG PO TABS: 1.0000 mg | ORAL_TABLET | ORAL | Status: DC | PRN

## 2023-07-24 MED ORDER — TETANUS-DIPHTH-ACELL PERTUSSIS 5-2.5-18.5 LF-MCG/0.5 IM SUSY
0.5000 mL | PREFILLED_SYRINGE | Freq: Once | INTRAMUSCULAR | Status: AC
  Administered 2023-07-24: 0.5 mL via INTRAMUSCULAR
  Filled 2023-07-24: qty 0.5

## 2023-07-24 MED ORDER — IOHEXOL 350 MG/ML SOLN
75.0000 mL | Freq: Once | INTRAVENOUS | Status: AC | PRN
  Administered 2023-07-24: 75 mL via INTRAVENOUS

## 2023-07-24 MED ORDER — HYDROMORPHONE HCL 1 MG/ML IJ SOLN
1.0000 mg | Freq: Once | INTRAMUSCULAR | Status: AC
  Administered 2023-07-24: 1 mg via INTRAVENOUS
  Filled 2023-07-24: qty 1

## 2023-07-24 MED ORDER — ACETAMINOPHEN 500 MG PO TABS
1000.0000 mg | ORAL_TABLET | Freq: Four times a day (QID) | ORAL | Status: DC
  Administered 2023-07-24 – 2023-07-25 (×3): 1000 mg via ORAL
  Filled 2023-07-24 (×3): qty 2

## 2023-07-24 MED ORDER — METOPROLOL TARTRATE 5 MG/5ML IV SOLN: 5.0000 mg | Freq: Four times a day (QID) | INTRAVENOUS | Status: DC | PRN

## 2023-07-24 MED ORDER — SODIUM CHLORIDE 0.9 % IV SOLN: Freq: Once | INTRAVENOUS | Status: AC

## 2023-07-24 MED ORDER — METHOCARBAMOL 500 MG PO TABS: 1000.0000 mg | ORAL_TABLET | Freq: Four times a day (QID) | ORAL | Status: DC

## 2023-07-24 MED ORDER — ONDANSETRON 4 MG PO TBDP: 4.0000 mg | ORAL_TABLET | Freq: Four times a day (QID) | ORAL | Status: DC | PRN

## 2023-07-24 MED ORDER — ONDANSETRON HCL 4 MG/2ML IJ SOLN: 4.0000 mg | Freq: Four times a day (QID) | INTRAMUSCULAR | Status: DC | PRN

## 2023-07-24 MED ORDER — DOCUSATE SODIUM 100 MG PO CAPS
100.0000 mg | ORAL_CAPSULE | Freq: Two times a day (BID) | ORAL | Status: DC
  Administered 2023-07-25: 100 mg via ORAL
  Filled 2023-07-24: qty 1

## 2023-07-24 MED ORDER — ALPRAZOLAM 0.5 MG PO TABS
0.5000 mg | ORAL_TABLET | Freq: Two times a day (BID) | ORAL | Status: DC | PRN
  Administered 2023-07-25: 0.5 mg via ORAL
  Filled 2023-07-24: qty 1

## 2023-07-24 MED ORDER — METHOCARBAMOL 500 MG PO TABS
1000.0000 mg | ORAL_TABLET | Freq: Once | ORAL | Status: AC
  Administered 2023-07-24: 1000 mg via ORAL
  Filled 2023-07-24: qty 2

## 2023-07-24 MED ORDER — LORAZEPAM 2 MG/ML IJ SOLN: 1.0000 mg | INTRAMUSCULAR | Status: DC | PRN

## 2023-07-24 MED ORDER — ADULT MULTIVITAMIN W/MINERALS CH
1.0000 | ORAL_TABLET | Freq: Every day | ORAL | Status: DC
  Administered 2023-07-24 – 2023-07-25 (×2): 1 via ORAL
  Filled 2023-07-24 (×2): qty 1

## 2023-07-24 MED ORDER — FOLIC ACID 1 MG PO TABS
1.0000 mg | ORAL_TABLET | Freq: Every day | ORAL | Status: DC
  Administered 2023-07-24 – 2023-07-25 (×2): 1 mg via ORAL
  Filled 2023-07-24 (×2): qty 1

## 2023-07-24 MED ORDER — POLYETHYLENE GLYCOL 3350 17 G PO PACK: 17.0000 g | PACK | Freq: Every day | ORAL | Status: DC | PRN

## 2023-07-24 MED ORDER — THIAMINE MONONITRATE 100 MG PO TABS
100.0000 mg | ORAL_TABLET | Freq: Every day | ORAL | Status: DC
  Administered 2023-07-24 – 2023-07-25 (×2): 100 mg via ORAL
  Filled 2023-07-24 (×2): qty 1
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
## 2023-07-24 NOTE — Progress Notes (Signed)
Orthopedic Tech Progress Note Patient Details:  [name] [birth year] [id number]  Patient ID: Derek [name], male   DOB: 1961-09-13, 62 y.o.   MRN: [id number] Level II; not currently needed. Darleen Crocker 07/24/2023, 6:46 AM

## 2023-07-24 NOTE — ED Notes (Signed)
Patient transported to CT 
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
## 2023-07-24 NOTE — ED Notes (Signed)
Pt ambulatory to bathroom

## 2023-07-24 NOTE — ED Notes (Signed)
Pt placed on 3L of O2 
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
## 2023-07-24 NOTE — Progress Notes (Signed)
Patient arrived to unit via ED. Patient Aox4, vitals WNL and focused assessment completed. Dressing Vaseline guaze and Kerlix applied to R arm. Patient oriented to unit. Call light in reach and bed in lowest position.
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
## 2023-07-25 ENCOUNTER — Inpatient Hospital Stay (HOSPITAL_COMMUNITY): Payer: BC Managed Care – PPO

## 2023-07-25 LAB — BASIC METABOLIC PANEL
Anion gap: 6 (ref 5–15)
BUN: 14 mg/dL (ref 6–20)
CO2: 24 mmol/L (ref 22–32)
Calcium: 8.4 mg/dL — ABNORMAL LOW (ref 8.9–10.3)
Chloride: 105 mmol/L (ref 98–111)
Creatinine, Ser: 1.15 mg/dL (ref 0.61–1.24)
GFR, Estimated: >60 mL/min (ref >60)
Glucose, Bld: 97 mg/dL (ref 70–99)
Potassium: 4 mmol/L (ref 3.5–5.1)
Sodium: 135 mmol/L (ref 135–145)

## 2023-07-25 LAB — CBC
HCT: 47 % (ref 39.0–52.0)
Hemoglobin: 15.1 g/dL (ref 13.0–17.0)
MCH: 29.8 pg (ref 26.0–34.0)
MCHC: 32.1 g/dL (ref 30.0–36.0)
MCV: 92.9 fL (ref 80.0–100.0)
Platelets: 216 10*3/uL (ref 150–400)
RBC: 5.06 MIL/uL (ref 4.22–5.81)
RDW: 13.5 % (ref 11.5–15.5)
WBC: 8.9 10*3/uL (ref 4.0–10.5)
nRBC: 0 % (ref 0.0–0.2)

## 2023-07-25 MED ORDER — APIXABAN 5 MG PO TABS
5.0000 mg | ORAL_TABLET | Freq: Two times a day (BID) | ORAL | Status: DC
  Filled 2023-07-25: qty 1

## 2023-07-25 MED ORDER — OXYCODONE HCL 10 MG PO TABS: 10.0000 mg | ORAL_TABLET | ORAL | 0 refills | Status: DC | PRN

## 2023-07-25 MED ORDER — DOCUSATE SODIUM 100 MG PO CAPS: 100.0000 mg | ORAL_CAPSULE | Freq: Two times a day (BID) | ORAL | 0 refills | Status: DC

## 2023-07-25 MED ORDER — IBUPROFEN 200 MG PO TABS: 600.0000 mg | ORAL_TABLET | Freq: Three times a day (TID) | ORAL | Status: DC | PRN

## 2023-07-25 MED ORDER — METHOCARBAMOL 1000 MG PO TABS: 1000.0000 mg | ORAL_TABLET | Freq: Four times a day (QID) | ORAL | 1 refills | Status: DC | PRN

## 2023-07-25 MED ORDER — ACETAMINOPHEN 500 MG PO TABS: 1000.0000 mg | ORAL_TABLET | Freq: Four times a day (QID) | ORAL | 0 refills | Status: DC
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
## 2023-07-25 NOTE — Progress Notes (Signed)
Patient declines to take his Eliquis here at the hospital due to cost. He says he does not want to be billed. He states he will take his eliquis at home.

## 2023-07-25 NOTE — TOC CAGE-AID Note (Signed)
Transition of Care [facility]) - CAGE-AID Screening   Patient Details  Name: [name] MRN: [id number] Date of Birth: [birth year]  [facility] CM/SW Contact:    Leota Sauers, RN Phone Number: 07/25/2023, 6:58 AM   Clinical Narrative:  Patient endorses drinking alcohol daily. Denies illicit drugs. Consult for SA placed.  CAGE-AID Screening:    Have You Ever Felt You Ought to Cut Down on Your Drinking or Drug Use?: No Have People Annoyed You By Critizing Your Drinking Or Drug Use?: No Have You Felt Bad Or Guilty About Your Drinking Or Drug Use?: No Have You Ever Had a Drink or Used Drugs First Thing In The Morning to Steady Your Nerves or to Get Rid of a Hangover?: No CAGE-AID Score: 0  Substance Abuse Education Offered: No

## 2023-07-25 NOTE — Discharge Summary (Signed)
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 continue home xanax and Zoloft Daily EtOH use - CIWA    On  07/25/23 his vitals were stable, oxygenating on room air, mobilizing with therapies, tolerating PO, and felt stable for discharge home. Follow up with PCP as below.   Physical Exam: General:  Alert, NAD, pleasant Pulm: chest wall appropriately tender, pulling > 1,000 cc on IS, normal respiration effort, CTAB Abd:  Soft, nontender RUE: dressing c/d/i  Allergies as of 07/25/2023   No Known Allergies      Medication List     TAKE these medications    acetaminophen 500 MG tablet Commonly known as: TYLENOL Take 2 tablets (1,000 mg total) by mouth every 6 (six) hours.   ALPRAZolam 1 MG tablet Commonly known as: XANAX TAKE 1/2-1 TAB PO TID PRN PANIC What changed:  how much to take how to take this when to take this reasons to take this additional instructions   Depo-Testosterone 200 MG/ML injection Generic drug: testosterone cypionate Inject 100 mg into the muscle every 28 (twenty-eight) days.   diltiazem 120 MG 24 hr capsule Commonly known as: Cardizem CD Take 1 capsule (120 mg total) by mouth daily.   docusate sodium 100 MG capsule Commonly known as: COLACE Take 1 capsule (100 mg total) by mouth 2 (two) times daily.    Eliquis 5 MG Tabs tablet Generic drug: apixaban Take 5 mg by mouth 2 (two) times daily.   flecainide 50 MG tablet Commonly known as: TAMBOCOR Take 1 tablet (50 mg total) by mouth 2 (two) times daily.   ibuprofen 200 MG tablet Commonly known as: ADVIL Take 3 tablets (600 mg total) by mouth every 8 (eight) hours as needed for mild pain or moderate pain.   Methocarbamol 1000 MG Tabs Take 1,000 mg by mouth every 6 (six) hours as needed for muscle spasms.   Oxycodone HCl 10 MG Tabs Take 1 tablet (10 mg total) by mouth every 4 (four) hours as needed for moderate pain or severe pain (no releived by tylenol, advil, robaxin).   sertraline 100 MG tablet Commonly known as: ZOLOFT TAKE 1/2 OR 1 TABLET BY MOUTH EVERY DAY What changed:  how much to take how to take this when to take this additional instructions          Follow-up Information     Garlan Fillers, MD. Schedule an appointment as soon as possible for a visit.   Specialty: Internal Medicine Why: 1-2 weeks for follow up of recent hospital stay. Contact information: [address] [place] [address] [phone]         [facility] Follow up.   Why: call as needed Contact information: [address] [place] [address] [phone]                Signed: Hosie Spangle, [facility] 07/25/2023, 11:11 AM
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
## 2023-07-29 ENCOUNTER — Other Ambulatory Visit (HOSPITAL_COMMUNITY): Payer: Self-pay

## 2023-07-29 ENCOUNTER — Other Ambulatory Visit: Payer: Self-pay

## 2023-07-29 DIAGNOSIS — S2241XA Multiple fractures of ribs, right side, initial encounter for closed fracture: Secondary | ICD-10-CM | POA: Diagnosis not present

## 2023-07-29 DIAGNOSIS — I82401 Acute embolism and thrombosis of unspecified deep veins of right lower extremity: Secondary | ICD-10-CM | POA: Diagnosis not present

## 2023-08-03 ENCOUNTER — Other Ambulatory Visit: Payer: Self-pay | Admitting: Psychiatry

## 2023-08-03 DIAGNOSIS — F410 Panic disorder [episodic paroxysmal anxiety] without agoraphobia: Secondary | ICD-10-CM

## 2023-08-13 DIAGNOSIS — B372 Candidiasis of skin and nail: Secondary | ICD-10-CM | POA: Diagnosis not present

## 2023-08-19 ENCOUNTER — Ambulatory Visit (INDEPENDENT_AMBULATORY_CARE_PROVIDER_SITE_OTHER): Payer: BC Managed Care – PPO | Admitting: Psychiatry

## 2023-08-19 ENCOUNTER — Encounter: Payer: Self-pay | Admitting: Psychiatry

## 2023-08-19 DIAGNOSIS — F410 Panic disorder [episodic paroxysmal anxiety] without agoraphobia: Secondary | ICD-10-CM | POA: Diagnosis not present
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
## 2023-10-07 ENCOUNTER — Encounter (HOSPITAL_COMMUNITY): Payer: Self-pay | Admitting: Emergency Medicine

## 2023-10-07 ENCOUNTER — Observation Stay (HOSPITAL_COMMUNITY)
Admission: EM | Admit: 2023-10-07 | Discharge: 2023-10-08 | Disposition: A | Discharge status: Home / Self Care | Payer: BC Managed Care – PPO | Attending: Cardiology | Admitting: Cardiology

## 2023-10-07 ENCOUNTER — Emergency Department (HOSPITAL_COMMUNITY): Payer: BC Managed Care – PPO

## 2023-10-07 ENCOUNTER — Other Ambulatory Visit: Payer: Self-pay

## 2023-10-07 DIAGNOSIS — Q2112 Patent foramen ovale: Secondary | ICD-10-CM

## 2023-10-07 DIAGNOSIS — R42 Dizziness and giddiness: Secondary | ICD-10-CM | POA: Diagnosis not present

## 2023-10-07 DIAGNOSIS — R0789 Other chest pain: Principal | ICD-10-CM | POA: Insufficient documentation

## 2023-10-07 DIAGNOSIS — E669 Obesity, unspecified: Secondary | ICD-10-CM | POA: Insufficient documentation

## 2023-10-07 DIAGNOSIS — N1831 Chronic kidney disease, stage 3a: Secondary | ICD-10-CM | POA: Diagnosis not present

## 2023-10-07 DIAGNOSIS — R079 Chest pain, unspecified: Secondary | ICD-10-CM | POA: Diagnosis present

## 2023-10-07 DIAGNOSIS — I2510 Atherosclerotic heart disease of native coronary artery without angina pectoris: Secondary | ICD-10-CM | POA: Insufficient documentation

## 2023-10-07 DIAGNOSIS — G4733 Obstructive sleep apnea (adult) (pediatric): Secondary | ICD-10-CM | POA: Insufficient documentation

## 2023-10-07 DIAGNOSIS — F19280 Other psychoactive substance dependence with psychoactive substance-induced anxiety disorder: Secondary | ICD-10-CM | POA: Insufficient documentation

## 2023-10-07 DIAGNOSIS — I200 Unstable angina: Secondary | ICD-10-CM | POA: Diagnosis not present

## 2023-10-07 DIAGNOSIS — I471 Supraventricular tachycardia, unspecified: Secondary | ICD-10-CM | POA: Diagnosis not present

## 2023-10-07 DIAGNOSIS — I480 Paroxysmal atrial fibrillation: Secondary | ICD-10-CM | POA: Diagnosis not present

## 2023-10-07 DIAGNOSIS — F17220 Nicotine dependence, chewing tobacco, uncomplicated: Secondary | ICD-10-CM | POA: Diagnosis not present

## 2023-10-07 DIAGNOSIS — D6851 Activated protein C resistance: Secondary | ICD-10-CM | POA: Diagnosis not present

## 2023-10-07 DIAGNOSIS — R0989 Other specified symptoms and signs involving the circulatory and respiratory systems: Secondary | ICD-10-CM | POA: Diagnosis not present

## 2023-10-07 DIAGNOSIS — Z86718 Personal history of other venous thrombosis and embolism: Secondary | ICD-10-CM | POA: Insufficient documentation

## 2023-10-07 DIAGNOSIS — R072 Precordial pain: Secondary | ICD-10-CM | POA: Diagnosis not present

## 2023-10-07 DIAGNOSIS — R110 Nausea: Secondary | ICD-10-CM | POA: Diagnosis not present

## 2023-10-07 DIAGNOSIS — S2241XD Multiple fractures of ribs, right side, subsequent encounter for fracture with routine healing: Secondary | ICD-10-CM | POA: Diagnosis not present

## 2023-10-07 DIAGNOSIS — F419 Anxiety disorder, unspecified: Secondary | ICD-10-CM

## 2023-10-07 DIAGNOSIS — I129 Hypertensive chronic kidney disease with stage 1 through stage 4 chronic kidney disease, or unspecified chronic kidney disease: Secondary | ICD-10-CM | POA: Diagnosis not present

## 2023-10-07 HISTORY — DX: Paroxysmal atrial fibrillation: I48.0

## 2023-10-07 HISTORY — DX: Unspecified atrial flutter: I48.92

## 2023-10-07 HISTORY — DX: Essential (primary) hypertension: I10

## 2023-10-07 HISTORY — DX: Supraventricular tachycardia, unspecified: I47.10

## 2023-10-07 HISTORY — DX: Chronic kidney disease, stage 2 (mild): N18.2

## 2023-10-07 HISTORY — DX: Other supraventricular tachycardia: I47.19

## 2023-10-07 LAB — CBC
HCT: 51.5 % (ref 39.0–52.0)
Hemoglobin: 16.7 g/dL (ref 13.0–17.0)
MCH: 29.7 pg (ref 26.0–34.0)
MCHC: 32.4 g/dL (ref 30.0–36.0)
MCV: 91.5 fL (ref 80.0–100.0)
Platelets: 310 10*3/uL (ref 150–400)
RBC: 5.63 MIL/uL (ref 4.22–5.81)
RDW: 13.1 % (ref 11.5–15.5)
WBC: 8 10*3/uL (ref 4.0–10.5)
nRBC: 0 % (ref 0.0–0.2)

## 2023-10-07 LAB — BASIC METABOLIC PANEL
Anion gap: 9 (ref 5–15)
BUN: 7 mg/dL (ref 6–20)
CO2: 24 mmol/L (ref 22–32)
Calcium: 9.1 mg/dL (ref 8.9–10.3)
Chloride: 105 mmol/L (ref 98–111)
Creatinine, Ser: 1.01 mg/dL (ref 0.61–1.24)
GFR, Estimated: >60 mL/min (ref >60)
Glucose, Bld: 89 mg/dL (ref 70–99)
Potassium: 4.5 mmol/L (ref 3.5–5.1)
Sodium: 138 mmol/L (ref 135–145)

## 2023-10-07 LAB — TROPONIN I (HIGH SENSITIVITY)
Troponin I (High Sensitivity): 4 ng/L (ref <18)
Troponin I (High Sensitivity): 57 ng/L — ABNORMAL HIGH (ref <18)
Troponin I (High Sensitivity): 5 ng/L (ref <18)

## 2023-10-07 LAB — BRAIN NATRIURETIC PEPTIDE: B Natriuretic Peptide: 73.8 pg/mL (ref 0.0–100.0)

## 2023-10-07 LAB — D-DIMER, QUANTITATIVE: D-Dimer, Quant: 0.4 ug{FEU}/mL (ref 0.00–0.50)

## 2023-10-07 MED ORDER — NITROGLYCERIN 0.4 MG SL SUBL: 0.4000 mg | SUBLINGUAL_TABLET | SUBLINGUAL | Status: DC | PRN

## 2023-10-07 MED ORDER — APIXABAN 5 MG PO TABS
5.0000 mg | ORAL_TABLET | Freq: Two times a day (BID) | ORAL | Status: DC
  Administered 2023-10-07 – 2023-10-08 (×2): 5 mg via ORAL
  Filled 2023-10-07 (×2): qty 1

## 2023-10-07 MED ORDER — ASPIRIN 81 MG PO CHEW
324.0000 mg | CHEWABLE_TABLET | ORAL | Status: AC
  Administered 2023-10-07: 324 mg via ORAL
  Filled 2023-10-07: qty 4

## 2023-10-07 MED ORDER — SERTRALINE HCL 50 MG PO TABS
50.0000 mg | ORAL_TABLET | Freq: Every morning | ORAL | Status: DC
  Administered 2023-10-08: 50 mg via ORAL
  Filled 2023-10-07: qty 1

## 2023-10-07 MED ORDER — SODIUM CHLORIDE 0.9 % IV SOLN: 250.0000 mL | INTRAVENOUS | Status: DC | PRN

## 2023-10-07 MED ORDER — ASPIRIN 81 MG PO TBEC
81.0000 mg | DELAYED_RELEASE_TABLET | Freq: Every day | ORAL | Status: DC
  Administered 2023-10-08: 81 mg via ORAL
  Filled 2023-10-07: qty 1

## 2023-10-07 MED ORDER — ALPRAZOLAM 0.5 MG PO TABS: 0.5000 mg | ORAL_TABLET | Freq: Two times a day (BID) | ORAL | Status: DC | PRN

## 2023-10-07 MED ORDER — ACETAMINOPHEN 325 MG PO TABS: 650.0000 mg | ORAL_TABLET | ORAL | Status: DC | PRN

## 2023-10-07 MED ORDER — FLECAINIDE ACETATE 50 MG PO TABS
50.0000 mg | ORAL_TABLET | Freq: Two times a day (BID) | ORAL | Status: DC
  Administered 2023-10-07 – 2023-10-08 (×2): 50 mg via ORAL
  Filled 2023-10-07 (×3): qty 1

## 2023-10-07 MED ORDER — ONDANSETRON HCL 4 MG/2ML IJ SOLN: 4.0000 mg | Freq: Four times a day (QID) | INTRAMUSCULAR | Status: DC | PRN

## 2023-10-07 MED ORDER — SODIUM CHLORIDE 0.9% FLUSH
3.0000 mL | Freq: Two times a day (BID) | INTRAVENOUS | Status: DC
  Administered 2023-10-07 – 2023-10-08 (×2): 3 mL via INTRAVENOUS

## 2023-10-07 MED ORDER — DILTIAZEM HCL ER COATED BEADS 120 MG PO CP24
120.0000 mg | ORAL_CAPSULE | Freq: Every day | ORAL | Status: DC
  Administered 2023-10-08: 120 mg via ORAL
  Filled 2023-10-07: qty 1

## 2023-10-07 MED ORDER — ASPIRIN 300 MG RE SUPP: 300.0000 mg | RECTAL | Status: AC

## 2023-10-07 MED ORDER — SODIUM CHLORIDE 0.9% FLUSH: 3.0000 mL | INTRAVENOUS | Status: DC | PRN

## 2023-10-07 NOTE — ED Notes (Signed)
Patient taken to xray at this time.

## 2023-10-07 NOTE — ED Triage Notes (Signed)
Pt in with progressively worsened L chest pressure since 10/31. Pt also says he gets sob, nauseous and dizzy with exertion. States he had an ablation for SVT in June.
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
## 2023-10-07 NOTE — ED Notes (Signed)
Trop 57, Ray MD made aware

## 2023-10-07 NOTE — H&P (Addendum)
Please see consult note from earlier today which serves as this patient's H&P. Per discussion with CT team, unable to accommodate cor CTA today due to schedule limitations. Therefore per discussion with Dr. Jens Som, will admit to cardiology service and plan for coronary CTA tomorrow, allow him to eat today, continue Eliquis, and start ASA pending completion of coronary evaluation. Dr. Jens Som does not feel echo needed given recent study in June. Patient confirms he took all his usual AM meds today including Eliquis, diltiazem, flecainide, Xanax, and Zoloft.   Code Status: Full Code  Severity of Illness: The appropriate patient status for this patient is OBSERVATION. Observation status is judged to be reasonable and necessary in order to provide the required intensity of service to ensure the patient's safety. The patient's presenting symptoms, physical exam findings, and initial radiographic and laboratory data in the context of their medical condition is felt to place them at decreased risk for further clinical deterioration. Furthermore, it is anticipated that the patient will be medically stable for discharge from the hospital within 2 midnights of admission.    For questions or updates, please contact [facility] Please consult [email or web address] for contact info under     Signed, Laurann Montana, [name]  10/07/2023 10:09 AM
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
## 2023-10-08 ENCOUNTER — Observation Stay (HOSPITAL_BASED_OUTPATIENT_CLINIC_OR_DEPARTMENT_OTHER): Payer: BC Managed Care – PPO

## 2023-10-08 DIAGNOSIS — I2510 Atherosclerotic heart disease of native coronary artery without angina pectoris: Secondary | ICD-10-CM | POA: Insufficient documentation

## 2023-10-08 DIAGNOSIS — I471 Supraventricular tachycardia, unspecified: Secondary | ICD-10-CM | POA: Insufficient documentation

## 2023-10-08 DIAGNOSIS — R072 Precordial pain: Secondary | ICD-10-CM | POA: Diagnosis not present

## 2023-10-08 DIAGNOSIS — F419 Anxiety disorder, unspecified: Secondary | ICD-10-CM | POA: Diagnosis not present

## 2023-10-08 DIAGNOSIS — R0609 Other forms of dyspnea: Secondary | ICD-10-CM

## 2023-10-08 DIAGNOSIS — Q2112 Patent foramen ovale: Secondary | ICD-10-CM

## 2023-10-08 LAB — COMPREHENSIVE METABOLIC PANEL
ALT: 17 U/L (ref 0–44)
AST: 17 U/L (ref 15–41)
Albumin: 3.8 g/dL (ref 3.5–5.0)
Alkaline Phosphatase: 145 U/L — ABNORMAL HIGH (ref 38–126)
Anion gap: 9 (ref 5–15)
BUN: 8 mg/dL (ref 6–20)
CO2: 25 mmol/L (ref 22–32)
Calcium: 9 mg/dL (ref 8.9–10.3)
Chloride: 105 mmol/L (ref 98–111)
Creatinine, Ser: 1 mg/dL (ref 0.61–1.24)
GFR, Estimated: >60 mL/min (ref >60)
Glucose, Bld: 91 mg/dL (ref 70–99)
Potassium: 4.2 mmol/L (ref 3.5–5.1)
Sodium: 139 mmol/L (ref 135–145)
Total Bilirubin: 1.1 mg/dL (ref <1.2)
Total Protein: 6.6 g/dL (ref 6.5–8.1)

## 2023-10-08 LAB — CBC
HCT: 49.3 % (ref 39.0–52.0)
Hemoglobin: 16.2 g/dL (ref 13.0–17.0)
MCH: 29.4 pg (ref 26.0–34.0)
MCHC: 32.9 g/dL (ref 30.0–36.0)
MCV: 89.5 fL (ref 80.0–100.0)
Platelets: 297 10*3/uL (ref 150–400)
RBC: 5.51 MIL/uL (ref 4.22–5.81)
RDW: 13.2 % (ref 11.5–15.5)
WBC: 6.9 10*3/uL (ref 4.0–10.5)
nRBC: 0 % (ref 0.0–0.2)

## 2023-10-08 LAB — LIPID PANEL
Cholesterol: 198 mg/dL (ref 0–200)
HDL: 35 mg/dL — ABNORMAL LOW (ref >40)
LDL Cholesterol: 132 mg/dL — ABNORMAL HIGH (ref 0–99)
Total CHOL/HDL Ratio: 5.7 {ratio}
Triglycerides: 154 mg/dL — ABNORMAL HIGH (ref <150)
VLDL: 31 mg/dL (ref 0–40)

## 2023-10-08 LAB — TSH: TSH: 1.577 u[IU]/mL (ref 0.350–4.500)

## 2023-10-08 MED ORDER — METOPROLOL TARTRATE 5 MG/5ML IV SOLN: 10.0000 mg | INTRAVENOUS | Status: DC | PRN

## 2023-10-08 MED ORDER — IOHEXOL 350 MG/ML SOLN
95.0000 mL | Freq: Once | INTRAVENOUS | Status: AC | PRN
  Administered 2023-10-08: 95 mL via INTRAVENOUS

## 2023-10-08 MED ORDER — ROSUVASTATIN CALCIUM 10 MG PO TABS: 10.0000 mg | ORAL_TABLET | Freq: Every day | ORAL | 3 refills | Status: DC

## 2023-10-08 MED ORDER — NITROGLYCERIN 0.4 MG SL SUBL
0.8000 mg | SUBLINGUAL_TABLET | Freq: Once | SUBLINGUAL | Status: AC
  Administered 2023-10-08: 0.8 mg via SUBLINGUAL

## 2023-10-08 MED ORDER — METOPROLOL TARTRATE 5 MG/5ML IV SOLN
INTRAVENOUS | Status: AC
  Filled 2023-10-08: qty 5

## 2023-10-08 MED ORDER — DILTIAZEM HCL 25 MG/5ML IV SOLN
10.0000 mg | INTRAVENOUS | Status: DC | PRN
  Administered 2023-10-08: 5 mg via INTRAVENOUS

## 2023-10-08 MED ORDER — DILTIAZEM HCL 25 MG/5ML IV SOLN
INTRAVENOUS | Status: AC
  Filled 2023-10-08: qty 5

## 2023-10-08 MED ORDER — NITROGLYCERIN 0.4 MG SL SUBL
SUBLINGUAL_TABLET | SUBLINGUAL | Status: AC
  Filled 2023-10-08: qty 2

## 2023-10-08 NOTE — Consult Note (Signed)
[facility]  Consult   10/08/2023  ADRIEN SHANKAR 15-Mar-1961 [id number]  Value-Based Care Institute [VBCI]   Primary Care Provider: Garlan Fillers, MD, [facility], this provider is listed for the transition of care follow up appointments  and calls   [facility] Liaison screened the patient remotely at [facility]. Patient ready for discharge home. No pharmacy needs assessed for VBCI    The patient was screened for post hospital admissions.  The patient was assessed for potential Community Care Coordination service needs for post hospital transition for care coordination. Review of patient's electronic medical record reveals patient is home with no needs.   Plan: .  Referral request for community care coordination: No needs assessed for [facility] for VBCI.   VBCI Community Care Management/Population Health does not replace or interfere with any arrangements made by the Inpatient Transition of Care team.   For questions contact:   Charlesetta Shanks, RN, BSN, CCM [place]  [facility], [facility] Liaison Direct Dial: [phone] or secure chat Email: [email or web address]

## 2023-10-08 NOTE — TOC Initial Note (Signed)
Transition of Care [facility]) - Initial/Assessment Note    Patient Details  Name: Derek [name] MRN: [id number] Date of Birth: [birth year]  Transition of Care [facility]) CM/SW Contact:    Leone Haven, RN Phone Number: 10/08/2023, 11:58 AM  Clinical Narrative:                 From home with spouse, has PCP and insurance on file, states has no HH services in place at this time or DME at home.  States family member will transport them home at [employer] and family is support system, [facility] [address]  Pta self ambulatory.   Expected Discharge Plan: Home/Self Care Barriers to Discharge: No Barriers Identified   Patient Goals and CMS Choice Patient states their goals for this hospitalization and ongoing recovery are:: home   Choice offered to / list presented to : NA      Expected Discharge Plan and Services In-house Referral: NA Discharge Planning Services: CM Consult Post Acute Care Choice: NA Living arrangements for the past 2 months: Single Family Home Expected Discharge Date: 10/08/23               DME Arranged: N/A DME Agency: NA       HH Arranged: NA          Prior Living Arrangements/Services Living arrangements for the past 2 months: Single Family Home Lives with:: Self Patient language and need for interpreter reviewed:: Yes Do you feel safe going back to the place where you live?: Yes      Need for Family Participation in Patient Care: Yes (Comment) Care giver support system in place?: Yes (comment)   Criminal Activity/Legal Involvement Pertinent to Current Situation/Hospitalization: No - Comment as needed  Activities of Daily Living   ADL Screening (condition at time of admission) Independently performs ADLs?: No Does the patient have a NEW difficulty with bathing/dressing/toileting/self-feeding that is expected to last >3 days?: No Does the patient have a NEW difficulty with getting in/out of bed, walking, or climbing stairs that is  expected to last >3 days?: No Does the patient have a NEW difficulty with communication that is expected to last >3 days?: No Is the patient deaf or have difficulty hearing?: No Does the patient have difficulty seeing, even when wearing glasses/contacts?: No Does the patient have difficulty concentrating, remembering, or making decisions?: No  Permission Sought/Granted Permission sought to share information with : Case Manager Permission granted to share information with : Yes, Verbal Permission Granted              Emotional Assessment Appearance:: Appears stated age Attitude/Demeanor/Rapport: Engaged Affect (typically observed): Appropriate Orientation: : Oriented to Self, Oriented to Place, Oriented to  Time, Oriented to Situation Alcohol / Substance Use: Not Applicable Psych Involvement: No (comment)  Admission diagnosis:  Precordial pain [R07.2] Unstable angina (HCC) [I20.0] Chest pain [R07.9] Patient Active Problem List   Diagnosis Date Noted   PSVT (paroxysmal supraventricular tachycardia) (HCC) 10/08/2023   Coronary atherosclerosis 10/08/2023   PFO (patent foramen ovale) 10/08/2023   Chest pain 10/07/2023   Multiple rib fractures 07/24/2023   Paroxysmal atrial fibrillation (HCC) 06/17/2023   Atypical atrial flutter (HCC) 06/17/2023   Panic disorder without agoraphobia 09/18/2018   Factor V Leiden mutation (HCC) 09/02/2018   PCP:  Garlan Fillers, MD Pharmacy[facility]   [phone] [place] [facility] [address] [place] [address]  Phone: [phone] Fax: [phone]  [facility] [address] [place] [address] Phone: [phone] Fax: [phone]     Social Determinants of Health (SDOH) Social History: SDOH Screenings   Food Insecurity: No Food Insecurity (10/07/2023)  Housing: Low Risk  (10/07/2023)  Transportation Needs: No Transportation Needs (10/07/2023)   Utilities: Not At Risk (10/07/2023)  Tobacco Use: High Risk (10/07/2023)   SDOH Interventions:     Readmission Risk Interventions     No data to display

## 2023-10-08 NOTE — Progress Notes (Signed)
Mobility Specialist Progress Note:   10/08/23 1143  Mobility  Activity Ambulated independently in hallway  Level of Assistance Modified independent, requires aide device or extra time  Assistive Device None  Distance Ambulated (ft) 300 ft  Activity Response Tolerated well  Mobility Referral Yes  $Mobility charge 1 Mobility  Mobility Specialist Start Time (ACUTE ONLY) 1009  Mobility Specialist Stop Time (ACUTE ONLY) 1020  Mobility Specialist Time Calculation (min) (ACUTE ONLY) 11 min   Received pt in bed having no complaints and agreeable to mobility. Pt was asymptomatic throughout ambulation and returned to room w/o fault. Left in bed w/ call bell in reach and all needs met. Family in room.    Thompson Grayer Mobility Specialist  Please contact vis Secure Chat or  Rehab Office [phone] \
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
## 2023-10-08 NOTE — Plan of Care (Signed)
  Problem: Education: Goal: Knowledge of General Education information will improve Description: Including pain rating scale, medication(s)/side effects and non-pharmacologic comfort measures Outcome: Progressing   Problem: Health Behavior/Discharge Planning: Goal: Ability to manage health-related needs will improve Outcome: Progressing   Problem: Clinical Measurements: Goal: Ability to maintain clinical measurements within normal limits will improve Outcome: Progressing Goal: Will remain free from infection Outcome: Progressing Goal: Diagnostic test results will improve Outcome: Progressing Goal: Respiratory complications will improve Outcome: Progressing Goal: Cardiovascular complication will be avoided Outcome: Progressing   Problem: Activity: Goal: Risk for activity intolerance will decrease Outcome: Progressing   Problem: Nutrition: Goal: Adequate nutrition will be maintained Outcome: Progressing   Problem: Coping: Goal: Level of anxiety will decrease Outcome: Progressing   Problem: Elimination: Goal: Will not experience complications related to bowel motility Outcome: Progressing Goal: Will not experience complications related to urinary retention Outcome: Progressing   Problem: Pain Management: Goal: General experience of comfort will improve Outcome: Progressing   Problem: Safety: Goal: Ability to remain free from injury will improve Outcome: Progressing   Problem: Skin Integrity: Goal: Risk for impaired skin integrity will decrease Outcome: Progressing   Problem: Education: Goal: Understanding of cardiac disease, CV risk reduction, and recovery process will improve Outcome: Progressing Goal: Individualized Educational Video(s) Outcome: Progressing   Problem: Activity: Goal: Ability to tolerate increased activity will improve Outcome: Progressing   Problem: Cardiac: Goal: Ability to achieve and maintain adequate cardiovascular perfusion will  improve Outcome: Progressing   Problem: Health Behavior/Discharge Planning: Goal: Ability to safely manage health-related needs after discharge will improve Outcome: Progressing

## 2023-10-08 NOTE — TOC Transition Note (Signed)
Transition of Care [facility]) - CM/SW Discharge Note   Patient Details  Name: Derek [name] MRN: [id number] Date of Birth: [birth year]  Transition of Care [facility]) CM/SW Contact:  Leone Haven, RN Phone Number: 10/08/2023, 11:58 AM   Clinical Narrative:    For dc has no needs.    Final next level of care: Home/Self Care Barriers to Discharge: No Barriers Identified   Patient Goals and CMS Choice   Choice offered to / list presented to : NA  Discharge Placement                         Discharge Plan and Services Additional resources added to the After Visit Summary for   In-house Referral: NA Discharge Planning Services: CM Consult Post Acute Care Choice: NA          DME Arranged: N/A DME Agency: NA       HH Arranged: NA          Social Determinants of Health (SDOH) Interventions SDOH Screenings   Food Insecurity: No Food Insecurity (10/07/2023)  Housing: Low Risk  (10/07/2023)  Transportation Needs: No Transportation Needs (10/07/2023)  Utilities: Not At Risk (10/07/2023)  Tobacco Use: High Risk (10/07/2023)     Readmission Risk Interventions     No data to display

## 2023-10-09 LAB — LIPOPROTEIN A (LPA): Lipoprotein (a): 64.4 nmol/L — ABNORMAL HIGH (ref <75.0)

## 2023-10-16 ENCOUNTER — Encounter: Payer: Self-pay | Admitting: Psychiatry

## 2023-10-22 NOTE — Progress Notes (Unsigned)
Cardiology Office Note:  .   Date:  10/23/2023  ID:  KIMSEY VELARDO, DOB 10-Aug-1961, MRN [id number] PCP: Garlan Fillers, MD  [facility] Providers Cardiologist:  Reatha Harps, MD Electrophysiologist:  Lanier Prude, MD  History of Present Illness: .   Derek [name] is a 62 y.o. male with history of SVT, pAF, CAD, HLD, HTN who presents for follow-up.   History of Present Illness   Derek [name], a male patient with a history of SVT, paroxysmal atrial fibrillation, non-obstructive CAD, hypertension, and hyperlipidemia, presents for follow-up after recent SVT ablation. During the procedure, paroxysmal atrial fibrillation was discovered. The patient was also recently hospitalized for chest pain, which was found to be due to minimal non-obstructive CAD with no coronary calcium. He reports feeling low energy, which he attributes to his current medications, flecainide and diltiazem. He expresses a desire to stop these medications to see if his energy levels improve. He also mentions that he has mild sleep apnea and is currently working on weight loss. He has not yet filled his prescription for Crestor, a cholesterol medication, due to his busy work schedule.          Problem List SVT  -SVT ablation 06/03/2023 2. Paroxysmal Afib 3. Non-obstructive CAD -<25% LAD/LCX 10/08/2023 -TPV 48 mm3; mild -CAC=0 4. HLD -T chol 198, HDL 35, LDL 132, TG 154 5. Factor V Leiden  6. OSA 7. HTN 8. CKD IIIa    ROS: All other ROS reviewed and negative. Pertinent positives noted in the HPI.     Studies Reviewed: Marland Kitchen       Physical Exam:   VS:  BP 132/86 (BP Location: Right Arm, Patient Position: Sitting, Cuff Size: Normal)   Pulse 63   Ht 6' (1.829 m)   Wt 235 lb (106.6 kg)   SpO2 94%   BMI 31.87 kg/m    Wt Readings from Last 3 Encounters:  10/23/23 235 lb (106.6 kg)  10/07/23 236 lb 8.9 oz (107.3 kg)  07/24/23 240 lb (108.9 kg)    GEN: Well nourished, well developed in no acute  distress NECK: No JVD; No carotid bruits CARDIAC: RRR, no murmurs, rubs, gallops RESPIRATORY:  Clear to auscultation without rales, wheezing or rhonchi  ABDOMEN: Soft, non-tender, non-distended EXTREMITIES:  No edema; No deformity  ASSESSMENT AND PLAN: .   Assessment and Plan    Paroxysmal Atrial Fibrillation Recently underwent SVT ablation and found to have paroxysmal atrial fibrillation during the procedure. Currently on Flecainide and Diltiazem. Reports low energy and heart rate in the 40s-50s. No documented Afib since the procedure. -Plan to hold Flecainide and Diltiazem starting in January 2025 to assess for energy improvement. -Purchase [employer] for self-monitoring of heart rhythm. -continue eliquis   Non-Obstructive CAD Minimal plaque, calcium score zero. Recently hospitalized for chest pain, no significant findings. Not currently on cholesterol medication. -Start Crestor 10mg  daily.  Sleep Apnea Mild case reported. -Encouraged weight loss to potentially improve symptoms.  Follow-up in three months to assess energy levels off Flecainide and Diltiazem, and to discuss further management.              Follow-up: Return in about 3 months (around 01/23/2024).  Time Spent with Patient: I have spent a total of 35 minutes caring for this patient today face to face, ordering and reviewing labs/tests, reviewing prior records/medical history, examining the patient, establishing an assessment and plan, communicating results/findings to the patient/family, and documenting in the medical record.  Signed, Lenna Gilford. Flora Lipps, MD, [facility]  [facility]  [address] [place], [place] [address] [phone]  4:13 PM

## 2023-10-23 ENCOUNTER — Ambulatory Visit: Payer: BC Managed Care – PPO | Attending: Cardiovascular Disease | Admitting: Cardiovascular Disease

## 2023-10-23 ENCOUNTER — Encounter: Payer: Self-pay | Admitting: Cardiovascular Disease

## 2023-10-23 VITALS — BP 132/86 | HR 63 | Ht 72.0 in | Wt 235.0 lb

## 2023-10-23 DIAGNOSIS — R001 Bradycardia, unspecified: Secondary | ICD-10-CM

## 2023-10-23 DIAGNOSIS — I480 Paroxysmal atrial fibrillation: Secondary | ICD-10-CM | POA: Diagnosis not present

## 2023-10-23 DIAGNOSIS — I2510 Atherosclerotic heart disease of native coronary artery without angina pectoris: Secondary | ICD-10-CM | POA: Diagnosis not present

## 2023-10-23 DIAGNOSIS — E782 Mixed hyperlipidemia: Secondary | ICD-10-CM | POA: Diagnosis not present

## 2023-10-23 DIAGNOSIS — I471 Supraventricular tachycardia, unspecified: Secondary | ICD-10-CM | POA: Diagnosis not present

## 2023-10-23 MED ORDER — ROSUVASTATIN CALCIUM 10 MG PO TABS: 10.0000 mg | ORAL_TABLET | Freq: Every day | ORAL | 3 refills | Status: AC

## 2023-10-23 NOTE — Patient Instructions (Signed)
Medication Instructions:  - HOLD flecainide (TAMBOCOR) 50 MG tablet  - HOLD diltiazem (CARDIZEM CD) 120 MG 24 hr capsule  - START atorvastatin (CRESTOR) 10mg , once daily   *If you need a refill on your cardiac medications before your next appointment, please call your pharmacy*   Lab Work: None    If you have labs (blood work) drawn today and your tests are completely normal, you will receive your results only by: MyChart Message (if you have MyChart) OR A paper copy in the mail If you have any lab test that is abnormal or we need to change your treatment, we will call you to review the results.   Testing/Procedures: None   Follow-Up: At [facility], you and your health needs are our priority.  As part of our continuing mission to provide you with exceptional heart care, we have created designated Provider Care Teams.  These Care Teams include your primary Cardiologist (physician) and Advanced Practice Providers (APPs -  Physician Assistants and Nurse Practitioners) who all work together to provide you with the care you need, when you need it.  We recommend signing up for the patient portal called "MyChart".  Sign up information is provided on this After Visit Summary.  MyChart is used to connect with patients for Virtual Visits (Telemedicine).  Patients are able to view lab/test results, encounter notes, upcoming appointments, etc.  Non-urgent messages can be sent to your provider as well.   To learn more about what you can do with MyChart, go to [email or web address].    Your next appointment:   3 month(s)  The format for your next appointment:   In Person  Provider:   Reatha Harps, MD    Other Instructions

## 2023-10-26 ENCOUNTER — Other Ambulatory Visit: Payer: Self-pay | Admitting: Psychiatry

## 2023-10-26 ENCOUNTER — Other Ambulatory Visit: Payer: Self-pay | Admitting: Physician Assistant

## 2023-10-26 DIAGNOSIS — F410 Panic disorder [episodic paroxysmal anxiety] without agoraphobia: Secondary | ICD-10-CM

## 2023-11-01 DIAGNOSIS — Z125 Encounter for screening for malignant neoplasm of prostate: Secondary | ICD-10-CM | POA: Diagnosis not present

## 2023-11-01 DIAGNOSIS — Z1212 Encounter for screening for malignant neoplasm of rectum: Secondary | ICD-10-CM | POA: Diagnosis not present

## 2023-11-01 DIAGNOSIS — E291 Testicular hypofunction: Secondary | ICD-10-CM | POA: Diagnosis not present

## 2023-11-01 DIAGNOSIS — I10 Essential (primary) hypertension: Secondary | ICD-10-CM | POA: Diagnosis not present

## 2023-11-07 DIAGNOSIS — E291 Testicular hypofunction: Secondary | ICD-10-CM | POA: Diagnosis not present

## 2023-11-07 DIAGNOSIS — Z0000 Encounter for general adult medical examination without abnormal findings: Secondary | ICD-10-CM | POA: Diagnosis not present

## 2023-11-21 ENCOUNTER — Encounter: Payer: Self-pay | Admitting: Psychiatry

## 2023-11-21 ENCOUNTER — Ambulatory Visit (INDEPENDENT_AMBULATORY_CARE_PROVIDER_SITE_OTHER): Payer: BC Managed Care – PPO | Admitting: Psychiatry

## 2023-11-21 DIAGNOSIS — F410 Panic disorder [episodic paroxysmal anxiety] without agoraphobia: Secondary | ICD-10-CM

## 2023-11-21 MED ORDER — SERTRALINE HCL 100 MG PO TABS: ORAL_TABLET | ORAL | 2 refills | Status: AC

## 2023-11-21 MED ORDER — ALPRAZOLAM 1 MG PO TABS: ORAL_TABLET | ORAL | 5 refills | Status: AC
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
## 2023-12-05 ENCOUNTER — Other Ambulatory Visit: Payer: Self-pay | Admitting: Psychiatry

## 2023-12-05 DIAGNOSIS — F410 Panic disorder [episodic paroxysmal anxiety] without agoraphobia: Secondary | ICD-10-CM

## 2023-12-16 ENCOUNTER — Ambulatory Visit: Payer: BC Managed Care – PPO | Admitting: Psychiatry

## 2024-01-21 ENCOUNTER — Other Ambulatory Visit: Payer: Self-pay | Admitting: Physician Assistant

## 2024-02-17 ENCOUNTER — Encounter: Payer: Self-pay | Admitting: Cardiology

## 2024-02-20 MED ORDER — FLECAINIDE ACETATE 50 MG PO TABS: 50.0000 mg | ORAL_TABLET | Freq: Two times a day (BID) | ORAL | 2 refills | Status: DC

## 2024-03-24 ENCOUNTER — Ambulatory Visit (HOSPITAL_BASED_OUTPATIENT_CLINIC_OR_DEPARTMENT_OTHER): Admitting: Cardiovascular Disease

## 2024-03-24 DIAGNOSIS — I480 Paroxysmal atrial fibrillation: Secondary | ICD-10-CM | POA: Diagnosis not present

## 2024-03-24 DIAGNOSIS — R0609 Other forms of dyspnea: Secondary | ICD-10-CM | POA: Diagnosis not present

## 2024-04-16 DIAGNOSIS — F410 Panic disorder [episodic paroxysmal anxiety] without agoraphobia: Secondary | ICD-10-CM | POA: Diagnosis not present

## 2024-05-22 ENCOUNTER — Other Ambulatory Visit: Payer: Self-pay | Admitting: Cardiovascular Disease

## 2024-07-05 ENCOUNTER — Other Ambulatory Visit: Payer: Self-pay | Admitting: Cardiology

## 2024-08-08 ENCOUNTER — Other Ambulatory Visit: Payer: Self-pay | Admitting: Cardiology

## 2024-08-11 NOTE — Progress Notes (Unsigned)
  Cardiology Office Note:  .   Date:  08/11/2024  ID:  Derek [name], DOB December 25, 1960, MRN [id number] PCP: Yolande Toribio MATSU, MD  [facility] Providers Cardiologist:  Darryle ONEIDA Decent, MD Electrophysiologist:  OLE ONEIDA HOLTS, MD { Click to update primary MD,subspecialty MD or APP then REFRESH:1}   History of Present Illness: .   No chief complaint on file.   Derek [name] is a 63 y.o. male with history of pAF, CAD, CKD 3a, HTN, SVT who presents for follow-up.      Problem List SVT  -SVT ablation 06/03/2023 2. Paroxysmal Afib 3. Non-obstructive CAD -<25% LAD/LCX 10/08/2023 -TPV 48 mm3; mild -CAC=0 4. HLD -T chol 198, HDL 35, LDL 132, TG 154 5. Factor V Leiden  6. OSA 7. HTN 8. CKD IIIa    ROS: All other ROS reviewed and negative. Pertinent positives noted in the HPI.     Studies Reviewed: SABRA       CCTA 10/08/2023 IMPRESSION: 1.  Coronary calcium  score of 0.   2.  Normal coronary origin with left dominance.   3. Nonobstructive CAD, with noncalcified plaque causing minimal (0-24%) stenosis in mid LAD and mid LCX   4.  PFO   CAD-RADS 1. Minimal non-obstructive CAD (0-24%). Consider non-atherosclerotic causes of chest pain. Consider preventive therapy and risk factor modification. Physical Exam:   VS:  There were no vitals taken for this visit.   Wt Readings from Last 3 Encounters:  10/23/23 235 lb (106.6 kg)  10/07/23 236 lb 8.9 oz (107.3 kg)  07/24/23 240 lb (108.9 kg)    GEN: Well nourished, well developed in no acute distress NECK: No JVD; No carotid bruits CARDIAC: ***RRR, no murmurs, rubs, gallops RESPIRATORY:  Clear to auscultation without rales, wheezing or rhonchi  ABDOMEN: Soft, non-tender, non-distended EXTREMITIES:  No edema; No deformity  ASSESSMENT AND PLAN: .   ***    {Are you ordering a CV Procedure (e.g. stress test, cath, DCCV, TEE, etc)?   Press F2        :[id number]}   Follow-up: No follow-ups on file.  Time Spent with Patient: I  have spent a total of *** minutes caring for this patient today face to face, ordering and reviewing labs/tests, reviewing prior records/medical history, examining the patient, establishing an assessment and plan, communicating results/findings to the patient/family, and documenting in the medical record.   Signed, Darryle ONEIDA. Decent, MD, [facility]  [facility]  [address] [place], [place] [address] [phone]  7:52 PM

## 2024-08-12 ENCOUNTER — Encounter: Payer: Self-pay | Admitting: Cardiovascular Disease

## 2024-08-12 ENCOUNTER — Ambulatory Visit: Attending: Cardiovascular Disease | Admitting: Cardiovascular Disease

## 2024-08-12 VITALS — BP 136/90 | HR 67 | Ht 72.0 in | Wt 240.6 lb

## 2024-08-12 DIAGNOSIS — I471 Supraventricular tachycardia, unspecified: Secondary | ICD-10-CM

## 2024-08-12 DIAGNOSIS — E669 Obesity, unspecified: Secondary | ICD-10-CM

## 2024-08-12 DIAGNOSIS — I480 Paroxysmal atrial fibrillation: Secondary | ICD-10-CM | POA: Diagnosis not present

## 2024-08-12 DIAGNOSIS — E782 Mixed hyperlipidemia: Secondary | ICD-10-CM | POA: Diagnosis not present

## 2024-08-12 DIAGNOSIS — I2510 Atherosclerotic heart disease of native coronary artery without angina pectoris: Secondary | ICD-10-CM

## 2024-08-12 NOTE — Patient Instructions (Signed)
 Medication Instructions:  Your physician has recommended you make the following change in your medication:  STOP: Flecainide   STOP: Diltiazem   *If you need a refill on your cardiac medications before your next appointment, please call your pharmacy*  Lab Work: NONE  If you have labs (blood work) drawn today and your tests are completely normal, you will receive your results only by: MyChart Message (if you have MyChart) OR A paper copy in the mail If you have any lab test that is abnormal or we need to change your treatment, we will call you to review the results.  Testing/Procedures: NONE  Follow-Up: At [facility], you and your health needs are our priority.  As part of our continuing mission to provide you with exceptional heart care, our providers are all part of one team.  This team includes your primary Cardiologist (physician) and Advanced Practice Providers or APPs (Physician Assistants and Nurse Practitioners) who all work together to provide you with the care you need, when you need it.  Your next appointment:   8 week(s)  Provider:   Darryle ONEIDA Decent, MD

## 2024-08-26 NOTE — Telephone Encounter (Signed)
 Pt called in wanting to know what to do about his hearing loss not getting any better and did not want to waste time getting a hearing test for next visit with us . I explained to pt that the hearing test is vital for hearing loss exam to show what is going on behind ear drum. [facility] in front of our building did not have any opening before his next visit which we have a hearing test coord.

## 2024-10-12 ENCOUNTER — Ambulatory Visit: Admitting: Cardiovascular Disease

## 2024-11-03 DIAGNOSIS — E291 Testicular hypofunction: Secondary | ICD-10-CM | POA: Diagnosis not present

## 2024-11-03 DIAGNOSIS — Z1212 Encounter for screening for malignant neoplasm of rectum: Secondary | ICD-10-CM | POA: Diagnosis not present

## 2024-11-10 DIAGNOSIS — Z1331 Encounter for screening for depression: Secondary | ICD-10-CM | POA: Diagnosis not present

## 2024-11-10 DIAGNOSIS — Z0000 Encounter for general adult medical examination without abnormal findings: Secondary | ICD-10-CM | POA: Diagnosis not present

## 2024-11-10 DIAGNOSIS — I480 Paroxysmal atrial fibrillation: Secondary | ICD-10-CM | POA: Diagnosis not present

## 2024-11-10 DIAGNOSIS — Z1339 Encounter for screening examination for other mental health and behavioral disorders: Secondary | ICD-10-CM | POA: Diagnosis not present
